# Patient Record
Sex: Female | Born: 1965 | Race: White | Hispanic: No | Marital: Married | Smoking: Never smoker
Health system: Southern US, Community
[De-identification: ages and names within clinical notes are randomized; demographics above are authoritative.]

## PROBLEM LIST (undated history)

## (undated) DIAGNOSIS — K219 Gastro-esophageal reflux disease without esophagitis: Secondary | ICD-10-CM

## (undated) DIAGNOSIS — M810 Age-related osteoporosis without current pathological fracture: Secondary | ICD-10-CM

## (undated) DIAGNOSIS — M1990 Unspecified osteoarthritis, unspecified site: Secondary | ICD-10-CM

## (undated) DIAGNOSIS — N390 Urinary tract infection, site not specified: Secondary | ICD-10-CM

## (undated) DIAGNOSIS — Z5189 Encounter for other specified aftercare: Secondary | ICD-10-CM

## (undated) DIAGNOSIS — M719 Bursopathy, unspecified: Secondary | ICD-10-CM

## (undated) HISTORY — DX: Urinary tract infection, site not specified: N39.0

## (undated) HISTORY — DX: Gastro-esophageal reflux disease without esophagitis: K21.9

## (undated) HISTORY — DX: Age-related osteoporosis without current pathological fracture: M81.0

## (undated) HISTORY — DX: Encounter for other specified aftercare: Z51.89

## (undated) HISTORY — DX: Bursopathy, unspecified: M71.9

## (undated) HISTORY — PX: CERVICAL POLYPECTOMY: SHX88

## (undated) HISTORY — PX: WISDOM TOOTH EXTRACTION: SHX21

## (undated) HISTORY — PX: TOTAL HIP ARTHROPLASTY: SHX124

## (undated) HISTORY — DX: Unspecified osteoarthritis, unspecified site: M19.90

---

## 1998-01-18 ENCOUNTER — Other Ambulatory Visit: Admission: RE | Admit: 1998-01-18 | Discharge: 1998-01-18 | Payer: Self-pay | Admitting: Obstetrics and Gynecology

## 2002-09-01 ENCOUNTER — Other Ambulatory Visit: Admission: RE | Admit: 2002-09-01 | Discharge: 2002-09-01 | Payer: Self-pay | Admitting: Obstetrics and Gynecology

## 2003-09-05 ENCOUNTER — Other Ambulatory Visit: Admission: RE | Admit: 2003-09-05 | Discharge: 2003-09-05 | Payer: Self-pay | Admitting: Obstetrics and Gynecology

## 2004-04-14 ENCOUNTER — Emergency Department (HOSPITAL_COMMUNITY): Admission: EM | Admit: 2004-04-14 | Discharge: 2004-04-14 | Payer: Self-pay | Admitting: Emergency Medicine

## 2005-01-14 ENCOUNTER — Other Ambulatory Visit: Admission: RE | Admit: 2005-01-14 | Discharge: 2005-01-14 | Payer: Self-pay | Admitting: Obstetrics and Gynecology

## 2005-07-22 ENCOUNTER — Encounter: Payer: Self-pay | Admitting: Family Medicine

## 2005-12-11 ENCOUNTER — Ambulatory Visit: Payer: Self-pay | Admitting: Gastroenterology

## 2006-01-03 ENCOUNTER — Ambulatory Visit: Payer: Self-pay | Admitting: Gastroenterology

## 2008-06-30 ENCOUNTER — Ambulatory Visit: Payer: Self-pay | Admitting: Family Medicine

## 2008-06-30 DIAGNOSIS — M1610 Unilateral primary osteoarthritis, unspecified hip: Secondary | ICD-10-CM | POA: Insufficient documentation

## 2008-06-30 DIAGNOSIS — J45909 Unspecified asthma, uncomplicated: Secondary | ICD-10-CM | POA: Insufficient documentation

## 2008-06-30 DIAGNOSIS — M169 Osteoarthritis of hip, unspecified: Secondary | ICD-10-CM

## 2008-10-28 ENCOUNTER — Ambulatory Visit: Payer: Self-pay | Admitting: Family Medicine

## 2008-11-14 ENCOUNTER — Inpatient Hospital Stay (HOSPITAL_COMMUNITY): Admission: RE | Admit: 2008-11-14 | Discharge: 2008-11-17 | Payer: Self-pay | Admitting: Orthopedic Surgery

## 2008-11-21 ENCOUNTER — Telehealth: Payer: Self-pay | Admitting: Family Medicine

## 2009-03-15 ENCOUNTER — Telehealth: Payer: Self-pay | Admitting: Family Medicine

## 2009-06-05 ENCOUNTER — Encounter: Payer: Self-pay | Admitting: Internal Medicine

## 2009-10-25 ENCOUNTER — Ambulatory Visit: Payer: Self-pay | Admitting: Family Medicine

## 2009-10-25 DIAGNOSIS — R7309 Other abnormal glucose: Secondary | ICD-10-CM

## 2009-10-31 ENCOUNTER — Ambulatory Visit: Payer: Self-pay | Admitting: Family Medicine

## 2009-11-01 ENCOUNTER — Telehealth (INDEPENDENT_AMBULATORY_CARE_PROVIDER_SITE_OTHER): Payer: Self-pay | Admitting: *Deleted

## 2009-11-01 LAB — CONVERTED CEMR LAB: Hgb A1c MFr Bld: 5.8 % (ref 4.6–6.5)

## 2010-04-17 ENCOUNTER — Encounter: Payer: Self-pay | Admitting: Family Medicine

## 2010-05-18 ENCOUNTER — Encounter: Payer: Self-pay | Admitting: Family Medicine

## 2010-05-18 ENCOUNTER — Ambulatory Visit
Admission: RE | Admit: 2010-05-18 | Discharge: 2010-05-18 | Payer: Self-pay | Source: Home / Self Care | Attending: Family Medicine | Admitting: Family Medicine

## 2010-05-18 DIAGNOSIS — R799 Abnormal finding of blood chemistry, unspecified: Secondary | ICD-10-CM | POA: Insufficient documentation

## 2010-05-21 ENCOUNTER — Encounter: Payer: Self-pay | Admitting: Family Medicine

## 2010-05-31 NOTE — Assessment & Plan Note (Signed)
Summary: pre-op visit/med clearance/pt coming fasting req A1C/per nanc...   Vital Signs:  Patient profile:   45 year old female Weight:      266 pounds Temp:     98.8 degrees F oral BP sitting:   138 / 88  (left arm) Cuff size:   large  Vitals Entered By: Sid Falcon LPN (May 18, 2010 9:10 AM)  History of Present Illness: Patient here to discuss several items. Upcoming left total hip replacement. Previous history right total hip replacement over one year ago. History of hip dysplasia.  She has history of mild intermittent asthma which is rarely a problem. No regular medications for that. Reported mild hyperglycemia though recent A1c 5.8% off medications altogether.  Recent labs per gynecologist significant for normal thyroid functions, normal DHEA level, elevated testosterone level. She has had substantial weight gain in the past year. Poor dietary compliance. Plan is to start weight loss program after her surgery.  No history of cardiac problems. No recent dyspnea, dizziness, or chest pain.  Allergies (verified): No Known Drug Allergies  Past History:  Past Surgical History: Last updated: 10/25/2009 Polyp removal 2009 two IVF procedures 2009 R THR 7/10  Family History: Last updated: 05/18/2010 Family History of Arthritis Family History Breast cancer 1st degree relative <50  grandparent Family History of Colon CA 1st degree parents relative <60  grandparent Family History Depression parent Family History Diabetes 1st degree relative parent Mother Family History High cholesterol parent Family History Lung cancer grandparent Family History of Prostate CA 1st degree relative <50 father Family History of Stroke F 1st degree relative <60  grandparent Family History of Cardiovascular disorder  grandparent Family History Hypertension parent Mother and Father  Social History: Last updated: 06/30/2008 Occupation:  self employed, Primary school teacher, Statistician Married Current Smoker Alcohol use-no Drug use-no Regular exercise-no  Risk Factors: Exercise: no (06/30/2008)  Risk Factors: Smoking Status: current (06/30/2008)  Past Medical History: Arthritis bil hips, hip dysplasia Asthma, mild intermittent PMH-FH-SH reviewed-no changes except otherwise noted, PMH-FH-SH reviewed for relevance  Family History: Family History of Arthritis Family History Breast cancer 1st degree relative <50  grandparent Family History of Colon CA 1st degree parents relative <60  grandparent Family History Depression parent Family History Diabetes 1st degree relative parent Mother Family History High cholesterol parent Family History Lung cancer grandparent Family History of Prostate CA 1st degree relative <50 father Family History of Stroke F 1st degree relative <60  grandparent Family History of Cardiovascular disorder  grandparent Family History Hypertension parent Mother and Father  Review of Systems       The patient complains of weight gain.  The patient denies anorexia, fever, weight loss, vision loss, decreased hearing, hoarseness, chest pain, syncope, dyspnea on exertion, peripheral edema, prolonged cough, headaches, hemoptysis, abdominal pain, melena, hematochezia, severe indigestion/heartburn, hematuria, incontinence, genital sores, muscle weakness, suspicious skin lesions, transient blindness, difficulty walking, depression, unusual weight change, abnormal bleeding, enlarged lymph nodes, and breast masses.    Physical Exam  General:  Well-developed,well-nourished,in no acute distress; alert,appropriate and cooperative throughout examination Head:  Normocephalic and atraumatic without obvious abnormalities. No apparent alopecia or balding. Eyes:  No corneal or conjunctival inflammation noted. EOMI. Perrla. Funduscopic exam benign, without hemorrhages, exudates or papilledema. Vision grossly normal. Ears:  External ear exam shows no  significant lesions or deformities.  Otoscopic examination reveals clear canals, tympanic membranes are intact bilaterally without bulging, retraction, inflammation or discharge. Hearing is grossly normal bilaterally. Mouth:  Oral mucosa and oropharynx  without lesions or exudates.  Teeth in good repair. Neck:  No deformities, masses, or tenderness noted. Lungs:  Normal respiratory effort, chest expands symmetrically. Lungs are clear to auscultation, no crackles or wheezes. Heart:  Normal rate and regular rhythm. S1 and S2 normal without gallop, murmur, click, rub or other extra sounds. Abdomen:  Bowel sounds positive,abdomen soft and non-tender without masses, organomegaly or hernias noted. Extremities:  No clubbing, cyanosis, edema, or deformity noted with normal full range of motion of all joints.   Neurologic:  alert & oriented X3, cranial nerves II-XII intact, and strength normal in all extremities.   Skin:  no rashes and no suspicious lesions.   Cervical Nodes:  No lymphadenopathy noted Psych:  Cognition and judgment appear intact. Alert and cooperative with normal attention span and concentration. No apparent delusions, illusions, hallucinations   Impression & Recommendations:  Problem # 1:  PREOPERATIVE EXAMINATION (ICD-V72.84) EKG unremarkable.  No contraindications for surgery. Orders: EKG w/ Interpretation (93000)  Problem # 2:  DEGENERATIVE JOINT DISEASE, HIPS (ICD-715.95)  Her updated medication list for this problem includes:    Tramadol Hcl 50 Mg Tabs (Tramadol hcl) .Marland Kitchen... 1-2 by mouth q 6 hours as needed  Problem # 3:  TESTOSTERONE, SERUM, ELEVATED (ICD-790.99) discussed fact that her weight gain has a role.  GYN apparently considering use of OCPs and  she will be starting weight loss program after surgery.  Problem # 4:  ASTHMA (ICD-493.90)  Complete Medication List: 1)  Tramadol Hcl 50 Mg Tabs (Tramadol hcl) .Marland Kitchen.. 1-2 by mouth q 6 hours as needed  Patient  Instructions: 1)  You need to lose weight. Consider a lower calorie diet and regular exercise. Consider repeat testosteone levels in 6 months after weight loss efforts.   Orders Added: 1)  EKG w/ Interpretation [93000] 2)  Est. Patient Level IV [04540]

## 2010-05-31 NOTE — Discharge Summary (Signed)
  Hospital Discharge  Date of admission:06/01/2009  Date of discharge:06/05/2009  Brief reason for admission/active problems: Cellulits, treated with vanc for 5 days, much improved since, d/c on DS bactrim for 10days .  Followup needed: check for remaining cellulits, check CBC for WBC.  Prescriptions: ZOFRAN 8 MG TABS (ONDANSETRON HCL) take one tablet by mouth every 6 hours as needed for nausea  #20 x 0   Entered and Authorized by:   Darnelle Maffucci MD   Signed by:   Darnelle Maffucci MD on 06/05/2009   Method used:   Print then Give to Patient   RxID:   [id number] BACTRIM DS 800-160 MG TABS (SULFAMETHOXAZOLE-TRIMETHOPRIM) Take 1 tablet by mouth twice a day  #20 x 0   Entered and Authorized by:   Darnelle Maffucci MD   Signed by:   Darnelle Maffucci MD on 06/05/2009   Method used:   Print then Give to Patient   RxID:   [id number]   The medication and problem lists have been updated.  Please see the dictated discharge summary for details.   Patient Instructions: 1)  Please come for a followup at the outpatient clinic at [facility], we will contact you with an appointment time and date 2)  Please take your medication as prescribed below. 3)  If you have any problem, Please call the clinic or if it's an emergency go to the emergency department or dial 911.

## 2010-05-31 NOTE — Letter (Signed)
Summary: Pre-Operative Clearance  Pre-Operative Clearance   Imported By: Georgian Co 05/21/2010 08:28:04  _____________________________________________________________________  External Attachment:    Type:   Image     Comment:   External Document

## 2010-05-31 NOTE — Assessment & Plan Note (Signed)
Summary: NECK& EAR PAIN/RCD   Vital Signs:  Patient profile:   45 year old female Weight:      261 pounds Temp:     98.8 degrees F oral BP sitting:   122 / 90  (left arm) Cuff size:   large  Vitals Entered By: Sid Falcon LPN (October 25, 2009 11:09 AM) CC: ear, neck pain, jaw popps when eating   History of Present Illness: Monday onset of R ear pain.  Pain is actually below ear. Radiates to clavicle region.  No nodes.  No sore throat. Some pain with swallowing but not with chewing. Popping sensation jaw when eats.  She points to area of pain mostly R sternocleidomastoid region.  No injury.  No alleviating factors.  Mild aggravation with movement of neck.  No radiculopathy symptoms.  Pt reports that she was started within the past year on metformin for "weight loss" per her gyn.  She thinks her glucose might have been up but she does not know how high.  She does not recall ever having A1C. NO recent symptoms of thirst or urine frequency.  steady weight gain this past year.  R THR 7/10 and that went uneventfully.  Ambulating better.  Anticipates L replacement in future.  Allergies (verified): No Known Drug Allergies  Past History:  Past Medical History: Last updated: 06/30/2008 Arthritis bil hips Astihma child  Family History: Last updated: 06/30/2008 Family History of Arthritis Family History Breast cancer 1st degree relative <50  grandparent Family History of Colon CA 1st degree parents relative <60  grandparent Family History Depression parent Family History Diabetes 1st degree relative parent Family History High cholesterol parent Family History Lung cancer grandparent Family History of Prostate CA 1st degree relative <50 father Family History of Stroke F 1st degree relative <60  grandparent Family History of Cardiovascular disorder  grandparent Family History Hypertension parent  Social History: Last updated: 06/30/2008 Occupation:  self employed, Materials engineer, Engineer, maintenance (IT) Married Current Smoker Alcohol use-no Drug use-no Regular exercise-no  Past Surgical History: Polyp removal 2009 two IVF procedures 2009 R THR 7/10 PMH-FH-SH reviewed for relevance  Review of Systems       The patient complains of weight gain.  The patient denies fever, weight loss, decreased hearing, hoarseness, chest pain, syncope, dyspnea on exertion, and headaches.    Physical Exam  General:  Well-developed,well-nourished,in no acute distress; alert,appropriate and cooperative throughout examination Head:  signif click or popping sensation over TMJ joints bil with motion of mandible but nontender. Ears:  External ear exam shows no significant lesions or deformities.  Otoscopic examination reveals clear canals, tympanic membranes are intact bilaterally without bulging, retraction, inflammation or discharge. Hearing is grossly normal bilaterally. Nose:  External nasal examination shows no deformity or inflammation. Nasal mucosa are pink and moist without lesions or exudates. Mouth:  Oral mucosa and oropharynx without lesions or exudates.  Teeth in good repair. Neck:  Full ROM.  Mild tender of mid portion of R sternocleidomastoid.   No ecchymosis. No adenopathy and no mass.  No carotid bruits. Lungs:  Normal respiratory effort, chest expands symmetrically. Lungs are clear to auscultation, no crackles or wheezes. Heart:  normal rate, regular rhythm, and no murmur.   Skin:  no rashes. Cervical Nodes:  No lymphadenopathy noted   Impression & Recommendations:  Problem # 1:  NECK PAIN (ICD-723.1) Assessment New suspect muscular.  No adenopathy or mass.  Try NSAID. Her updated medication list for this problem includes:  Tramadol Hcl 50 Mg Tabs (Tramadol hcl) .Marland Kitchen... 1-2 by mouth q 6 hours as needed  Problem # 2:  HYPERGLYCEMIA (ICD-790.29) Assessment: New schedule fasting CBG and A1C.  Discussed diet and exercise.  Needs to work on weight loss. Her  updated medication list for this problem includes:    Metformin Hcl 500 Mg Tabs (Metformin hcl) ..... One tab three times a day  Problem # 3:  DEGENERATIVE JOINT DISEASE, HIPS (ICD-715.95) refilled tramadol.  Discussed appropriate exercises and need for weight loss. Her updated medication list for this problem includes:    Tramadol Hcl 50 Mg Tabs (Tramadol hcl) .Marland Kitchen... 1-2 by mouth q 6 hours as needed  Complete Medication List: 1)  Bactrim Ds 800-160 Mg Tabs (Sulfamethoxazole-trimethoprim) .... Take 1 tablet by mouth twice a day 2)  Zofran 8 Mg Tabs (Ondansetron hcl) .... Take one tablet by mouth every 6 hours as needed for nausea 3)  Metformin Hcl 500 Mg Tabs (Metformin hcl) .... One tab three times a day 4)  Tramadol Hcl 50 Mg Tabs (Tramadol hcl) .Marland Kitchen.. 1-2 by mouth q 6 hours as needed  Patient Instructions: 1)  Schedule fasting glucose and Hgb A1C in the next couple of weeks:  250.00 2)  It is important that you exercise reguarly at least 20 minutes 5 times a week. If you develop chest pain, have severe difficulty breathing, or feel very tired, stop exercising immediately and seek medical attention.  3)  You need to lose weight. Consider a lower calorie diet and regular exercise.  4)  Try Alleve or advil for the next few days for neck pain. Prescriptions: TRAMADOL HCL 50 MG TABS (TRAMADOL HCL) 1-2 by mouth q 6 hours as needed  #120 x 5   Entered and Authorized by:   Evelena Peat MD   Signed by:   Evelena Peat MD on 10/25/2009   Method used:   Electronically to        [facility]       [address]       [place], [place]  [address]       Ph: [phone]       Fax: [phone]   RxID:   [id number]

## 2010-05-31 NOTE — Progress Notes (Signed)
  Phone Note Call from Patient   Summary of Call: Pt wanders if she should quit taking Metformin and repeat A1C?  Initial call taken by: Josph Macho RMA,  November 01, 2009 10:09 AM  Follow-up for Phone Call        I think this would be reasonable.  would consider recheck in 3 months.  In the meantime, work on weight loss. Follow-up by: Evelena Peat MD,  November 01, 2009 12:50 PM  Additional Follow-up for Phone Call Additional follow up Details #1::        Informed pt Additional Follow-up by: Josph Macho RMA,  November 01, 2009 1:15 PM

## 2010-06-06 NOTE — Letter (Signed)
Summary: Request for Surgical Clearance/[facility]  Request for Surgical Clearance/[facility]   Imported By: Maryln Gottron 05/29/2010 11:19:20  _____________________________________________________________________  External Attachment:    Type:   Image     Comment:   External Document

## 2010-06-23 ENCOUNTER — Emergency Department (HOSPITAL_BASED_OUTPATIENT_CLINIC_OR_DEPARTMENT_OTHER)
Admission: EM | Admit: 2010-06-23 | Discharge: 2010-06-23 | Disposition: A | Discharge status: Home / Self Care | Payer: 59 | Attending: Emergency Medicine | Admitting: Emergency Medicine

## 2010-06-23 DIAGNOSIS — W260XXA Contact with knife, initial encounter: Secondary | ICD-10-CM | POA: Insufficient documentation

## 2010-06-23 DIAGNOSIS — S61209A Unspecified open wound of unspecified finger without damage to nail, initial encounter: Secondary | ICD-10-CM | POA: Insufficient documentation

## 2010-06-23 DIAGNOSIS — Y92009 Unspecified place in unspecified non-institutional (private) residence as the place of occurrence of the external cause: Secondary | ICD-10-CM | POA: Insufficient documentation

## 2010-06-23 DIAGNOSIS — W261XXA Contact with sword or dagger, initial encounter: Secondary | ICD-10-CM | POA: Insufficient documentation

## 2010-06-27 ENCOUNTER — Encounter (HOSPITAL_COMMUNITY): Payer: 59

## 2010-06-27 ENCOUNTER — Other Ambulatory Visit: Payer: Self-pay | Admitting: Orthopedic Surgery

## 2010-06-27 ENCOUNTER — Other Ambulatory Visit (HOSPITAL_COMMUNITY): Payer: Self-pay | Admitting: Orthopedic Surgery

## 2010-06-27 ENCOUNTER — Ambulatory Visit (HOSPITAL_COMMUNITY)
Admission: RE | Admit: 2010-06-27 | Discharge: 2010-06-27 | Disposition: A | Discharge status: Home / Self Care | Payer: 59 | Source: Ambulatory Visit | Attending: Orthopedic Surgery | Admitting: Orthopedic Surgery

## 2010-06-27 DIAGNOSIS — Z01811 Encounter for preprocedural respiratory examination: Secondary | ICD-10-CM | POA: Insufficient documentation

## 2010-06-27 DIAGNOSIS — M169 Osteoarthritis of hip, unspecified: Secondary | ICD-10-CM | POA: Insufficient documentation

## 2010-06-27 DIAGNOSIS — M1610 Unilateral primary osteoarthritis, unspecified hip: Secondary | ICD-10-CM | POA: Insufficient documentation

## 2010-06-27 DIAGNOSIS — Z01818 Encounter for other preprocedural examination: Secondary | ICD-10-CM | POA: Insufficient documentation

## 2010-06-27 DIAGNOSIS — Z01812 Encounter for preprocedural laboratory examination: Secondary | ICD-10-CM | POA: Insufficient documentation

## 2010-06-27 LAB — COMPREHENSIVE METABOLIC PANEL
ALT: 21 U/L (ref 0–35)
Alkaline Phosphatase: 57 U/L (ref 39–117)
BUN: 21 mg/dL (ref 6–23)
CO2: 23 mEq/L (ref 19–32)
Chloride: 108 mEq/L (ref 96–112)
Glucose, Bld: 103 mg/dL — ABNORMAL HIGH (ref 70–99)
Potassium: 4.1 mEq/L (ref 3.5–5.1)
Sodium: 139 mEq/L (ref 135–145)
Total Bilirubin: 0.6 mg/dL (ref 0.3–1.2)

## 2010-06-27 LAB — URINALYSIS, ROUTINE W REFLEX MICROSCOPIC
Leukocytes, UA: NEGATIVE
Protein, ur: NEGATIVE mg/dL
Urine Glucose, Fasting: NEGATIVE mg/dL
pH: 7 (ref 5.0–8.0)

## 2010-06-27 LAB — SURGICAL PCR SCREEN: Staphylococcus aureus: NEGATIVE

## 2010-06-27 LAB — URINE MICROSCOPIC-ADD ON

## 2010-06-27 LAB — CBC
HCT: 42.3 % (ref 36.0–46.0)
Hemoglobin: 13.7 g/dL (ref 12.0–15.0)
MCV: 91 fL (ref 78.0–100.0)
RBC: 4.65 MIL/uL (ref 3.87–5.11)
RDW: 13.7 % (ref 11.5–15.5)
WBC: 7.2 10*3/uL (ref 4.0–10.5)

## 2010-06-27 LAB — HCG, SERUM, QUALITATIVE: Preg, Serum: NEGATIVE

## 2010-06-27 LAB — PROTIME-INR: Prothrombin Time: 13.2 seconds (ref 11.6–15.2)

## 2010-07-04 ENCOUNTER — Inpatient Hospital Stay (HOSPITAL_COMMUNITY)
Admission: RE | Admit: 2010-07-04 | Discharge: 2010-07-07 | DRG: 470 | Disposition: A | Discharge status: Home Health | Payer: 59 | Source: Ambulatory Visit | Attending: Orthopedic Surgery | Admitting: Orthopedic Surgery

## 2010-07-04 ENCOUNTER — Inpatient Hospital Stay (HOSPITAL_COMMUNITY): Payer: 59

## 2010-07-04 DIAGNOSIS — M1610 Unilateral primary osteoarthritis, unspecified hip: Principal | ICD-10-CM | POA: Diagnosis present

## 2010-07-04 DIAGNOSIS — I959 Hypotension, unspecified: Secondary | ICD-10-CM | POA: Diagnosis present

## 2010-07-04 DIAGNOSIS — E669 Obesity, unspecified: Secondary | ICD-10-CM | POA: Diagnosis present

## 2010-07-04 DIAGNOSIS — H547 Unspecified visual loss: Secondary | ICD-10-CM | POA: Diagnosis present

## 2010-07-04 DIAGNOSIS — Z87440 Personal history of urinary (tract) infections: Secondary | ICD-10-CM

## 2010-07-04 DIAGNOSIS — Z96649 Presence of unspecified artificial hip joint: Secondary | ICD-10-CM

## 2010-07-04 DIAGNOSIS — M169 Osteoarthritis of hip, unspecified: Principal | ICD-10-CM | POA: Diagnosis present

## 2010-07-04 DIAGNOSIS — Z791 Long term (current) use of non-steroidal anti-inflammatories (NSAID): Secondary | ICD-10-CM

## 2010-07-04 DIAGNOSIS — R110 Nausea: Secondary | ICD-10-CM | POA: Diagnosis not present

## 2010-07-04 LAB — CBC
MCHC: 32.2 g/dL (ref 30.0–36.0)
RDW: 13.3 % (ref 11.5–15.5)
WBC: 13.3 10*3/uL — ABNORMAL HIGH (ref 4.0–10.5)

## 2010-07-04 LAB — GLUCOSE, CAPILLARY: Glucose-Capillary: 140 mg/dL — ABNORMAL HIGH (ref 70–99)

## 2010-07-04 LAB — TYPE AND SCREEN
ABO/RH(D): O POS
Antibody Screen: NEGATIVE

## 2010-07-05 LAB — BASIC METABOLIC PANEL
Calcium: 8.1 mg/dL — ABNORMAL LOW (ref 8.4–10.5)
GFR calc Af Amer: >60 mL/min (ref >60)
GFR calc non Af Amer: >60 mL/min (ref >60)
Potassium: 3.6 mEq/L (ref 3.5–5.1)
Sodium: 136 mEq/L (ref 135–145)

## 2010-07-05 LAB — GLUCOSE, CAPILLARY
Glucose-Capillary: 103 mg/dL — ABNORMAL HIGH (ref 70–99)
Glucose-Capillary: 114 mg/dL — ABNORMAL HIGH (ref 70–99)
Glucose-Capillary: 118 mg/dL — ABNORMAL HIGH (ref 70–99)
Glucose-Capillary: 125 mg/dL — ABNORMAL HIGH (ref 70–99)
Glucose-Capillary: 136 mg/dL — ABNORMAL HIGH (ref 70–99)

## 2010-07-05 LAB — CBC
MCH: 28.2 pg (ref 26.0–34.0)
MCHC: 31.6 g/dL (ref 30.0–36.0)
MCHC: 31.3 g/dL (ref 30.0–36.0)
Platelets: 265 10*3/uL (ref 150–400)
RDW: 13.3 % (ref 11.5–15.5)
RDW: 13.4 % (ref 11.5–15.5)
WBC: 12.6 10*3/uL — ABNORMAL HIGH (ref 4.0–10.5)

## 2010-07-05 NOTE — Op Note (Signed)
[name], Abigail [name] NO.:  [id number]  MEDICAL RECORD NO.:  [id number]           PATIENT TYPE:  I  LOCATION:  0007                         FACILITY:  [facility]  PHYSICIAN:  Ollen Gross, M.D.    DATE OF BIRTH:  02-23-1966  DATE OF PROCEDURE:  07/04/2010 DATE OF DISCHARGE:                              OPERATIVE REPORT   PREOPERATIVE DIAGNOSIS:  Osteoarthritis left hip.  POSTOPERATIVE DIAGNOSIS:  Osteoarthritis left hip.  PROCEDURE:  Left total hip arthroplasty.  SURGEON:  Ollen Gross, M.D.  ASSISTANT:  Alexzandrew L. Perkins, P.A.C.  ANESTHESIA:  General.  ESTIMATED BLOOD LOSS:  600.  DRAINS:  Hemovac x1.  COMPLICATIONS:  None.  CONDITION:  Stable to recovery.  CLINICAL NOTE:  Abigail [name] is a 45 year old female with dysplasia and advanced end-stage arthritis of the left hip with progressively worsening pain and dysfunction.  She had a previous successful right total hip arthroplasty and presents now for left total hip arthroplasty.  PROCEDURE IN DETAIL:  After successful initiation of general anesthetic, the patient is placed a right lateral decubitus position with left side up and held with the hip positioner.  Left lower extremity is isolated from perineum with plastic drapes and prepped and draped in the usual sterile fashion.  Posterolateral incision was made with a 10 blade through subcutaneous tissue to the fascia lata which was incised in line with the skin incision.  Sciatic nerve was palpated and protected and short rotators and capsule isolated off the femur.  The hip is then dislocated and the center of the femoral head is marked.  Trial prosthesis is placed such that the center of the trial head corresponds to the level of the center of native femoral head.  Osteotomy line is marked on the femoral neck and osteotomy made with an oscillating saw. Femoral head is removed, and then retractor is placed around the proximal femur for access to  the canal.  Starter reamer was placed in the femoral canal, and then the canal was thoroughly irrigated with saline.  Axial reaming is performed up to 13.5 mm proximal reaming to 18 F and a sleeve machine to a large.  Trial 18 F large sleeve is placed.  Femur is retracted anteriorly to gain acetabular exposure.  Acetabular retractors were placed and labrum and osteophytes removed.  Reaming starts at 45.  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  I reamed it to medial wall.  Reamed up to 51 for placement of 52 mm Pinnacle acetabular shell.  There was excellent purchase with the cup. I also placed 2 dome screws.  Apex hole eliminator is placed and then the 32 mm neutral +4 Marathon liner was placed.  The trial femur which was 18 x 13 with 36+ 8 neck matching native anteversion is placed.  The 32+ 0 head is placed, it reduces too easily, so entered 32+ 6 which had appropriate soft-tissue tension.  Stability was fantastic with full extension, full external rotation, 70 degrees flexion, 40 degrees adduction, 90 degrees internal rotation, and 90 degrees of flexion, and 70 degrees of internal rotation.  By placing the  left leg on top of the right leg, it was felt the leg lengths were equal.  Hip is then dislocated and trials removed.  Permanent 18 F large sleeve is placed with the 18 x 13 stem and the 36+ 8 neck matching native anteversion.  The 32+ 6 ceramic head is placed.  The hip is reduced with the same stability parameters.  The wound was copiously irrigated with saline solution and then the short rotators and capsule, reattached the femur through drill holes with Ethibond suture.  The fascia lata is closed over Hemovac drain with interrupted #1 Vicryl, subcutaneous closed in multiple layers with #1 and 2-0 Vicryl and subcuticular running 4-0 Monocryl.  Incisions cleaned and dried and Steri-Strips applied.  The catheter for the Marcaine pain pump is placed and pump is  initiated.  A bulky sterile dressings is then applied, and she is placed into a knee immobilizer, awakened and transported to recovery in stable condition.     Ollen Gross, M.D.     FA/MEDQ  D:  07/04/2010  T:  07/04/2010  Job:  [id number]  Electronically Signed by Ollen Gross M.D. on 07/04/2010 03:51:31 PM

## 2010-07-05 NOTE — H&P (Addendum)
NAMEMARYPAT, KIMMET NO.:  [id number]  MEDICAL RECORD NO.:  [id number]           PATIENT TYPE:  I  LOCATION:  0007                         [facility]ACILITY[facility]  PHYSICIAN:  Ollen Gross, M.D.    DATE OF BIRTH:  1966-01-20  DATE OF ADMISSION:  07/04/2010 DATE OF DISCHARGE:                             HISTORY & PHYSICAL   CHIEF COMPLAINT:  Left hip pain.  BRIEF HISTORY:  Abigail [name] has been followed by Dr. Lequita Halt for worsening pain in the left hip.  She is about a year and half out from right total hip arthroplasty and she has done well with that.  She feels that this time the right hip has healed up enough and the left hip is becoming more and more painful and dysfunctional.  She now presents for left total hip arthroplasty.  PRIMARY CARE PHYSICIAN:  Abigail [name], M.D. and she has been cleared for surgery by Dr. Caryl Never and found to be very low risk.  MEDICATION ALLERGIES:  SHE WOULD PREFER THE USE OF PAPER TAPE.  CURRENT MEDICATIONS:  Tramadol 50 mg 1-2 tablets p.o. q. 4-6 hours p.r.n. pain.  PAST MEDICAL HISTORY: 1. End-stage arthritis of the left hip. 2. Impaired vision. 3. Tinnitus. 4. History of asthma as a child. 5. History of bronchitis. 6. Hemorrhoids. 7. History of urinary tract infection. 8. Arthritis. 9. Bursitis.  PAST SURGICAL HISTORY:  Right total hip arthroplasty in July 2010.  FAMILY MEDICAL HISTORY:  Father is 24.  He has history of ulcers and had a cholecystectomy.  Mother is deceased, she passed at the age of 58, she had diabetes.  SOCIAL HISTORY:  The patient is married.  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  She denies past or present use of tobacco products.  States that she drinks alcohol rarely.  She lives at home with her husband.  She does plan to go home following her hospital stay.  Her husband, also her father and mother-in-law will be around to take care of her.  Her home is one  level, so stairs will not be in issue.  We will send therapy at her house.  REVIEW OF SYSTEMS:  GENERAL:  Negative for fevers, chills or weight change.  HEENT: NEURO:  Minimal tinnitus.  Negative for blurred vision or balance problems.  DERMATOLOGIC:  She has sensitive skin. RESPIRATORY:  Negative for shortness of breath at rest or with exertion. CARDIOVASCULAR:  Negative for chest pain.  Last EKG was May 18, 2010.  GI:  Negative for nausea, vomiting or diarrhea.  GU:  Negative hematuria, dysuria.  MUSCULOSKELETAL:  Positive for joint pain, back pain and plantar fasciitis of the right foot.  PHYSICAL EXAMINATION:  VITAL SIGNS:  Pulse 80, respirations 18, blood pressure 116/78 in the left arm. GENERAL:  Abigail [name] is alert and oriented x3.  She is well developed and well nourished, in no apparent distress.  She is a pleasant 45 year old female.  She is a stated height of 5 feet 6 inches and a stated weight of 253 pounds. HEENT:  Normocephalic, atraumatic.  Extraocular  movements intact.  The patient wears glasses. NECK:  Supple.  Full range of motion without lymphadenopathy. CHEST:  Lungs are clear to auscultation bilaterally without wheezes. HEART:  Regular rate and rhythm without murmur, S1 and S2 sounds are appreciated. ABDOMEN:  Bowel sounds present in all 4 quadrants. EXTREMITIES:  Left hip, able to flex to 90 degrees.  No internal rotation, only about 10 degrees of external rotation and 10-20 degrees of abduction. SKIN:  Unremarkable. NEUROLOGIC:  Intact. PERIPHERAL VASCULAR:  Carotid pulses 2+ bilaterally without bruit.  RADIOGRAPH:  AP and lateral views of the patient's left hip revealed advanced end-stage arthritis with changes secondary to dysplasia.  IMPRESSION:  End-stage arthritis of the left hip.  PLAN:  Left total hip arthroplasty to be performed by Dr. Lequita Halt.     Rozell Searing, PAC   ______________________________ Ollen Gross, M.D.    LD/MEDQ   D:  07/04/2010  T:  07/04/2010  Job:  [id number]  Electronically Signed by Ollen Gross M.D. on 07/04/2010 03:51:28 PM Electronically Signed by Rozell Searing  on 07/05/2010 07:38:47 AM

## 2010-07-06 LAB — BASIC METABOLIC PANEL
BUN: 7 mg/dL (ref 6–23)
Creatinine, Ser: 0.64 mg/dL (ref 0.4–1.2)
GFR calc non Af Amer: >60 mL/min (ref >60)
Glucose, Bld: 141 mg/dL — ABNORMAL HIGH (ref 70–99)
Potassium: 3.1 mEq/L — ABNORMAL LOW (ref 3.5–5.1)

## 2010-07-06 LAB — GLUCOSE, CAPILLARY
Glucose-Capillary: 107 mg/dL — ABNORMAL HIGH (ref 70–99)
Glucose-Capillary: 117 mg/dL — ABNORMAL HIGH (ref 70–99)

## 2010-07-06 LAB — CBC
HCT: 32.3 % — ABNORMAL LOW (ref 36.0–46.0)
MCH: 28.5 pg (ref 26.0–34.0)
MCHC: 31.3 g/dL (ref 30.0–36.0)
MCV: 91.2 fL (ref 78.0–100.0)
RDW: 13.6 % (ref 11.5–15.5)
WBC: 11.1 10*3/uL — ABNORMAL HIGH (ref 4.0–10.5)

## 2010-07-07 LAB — BASIC METABOLIC PANEL
BUN: 8 mg/dL (ref 6–23)
CO2: 29 mEq/L (ref 19–32)
Chloride: 106 mEq/L (ref 96–112)
Creatinine, Ser: 0.56 mg/dL (ref 0.4–1.2)

## 2010-07-07 LAB — CBC
Hemoglobin: 10.3 g/dL — ABNORMAL LOW (ref 12.0–15.0)
MCH: 28.6 pg (ref 26.0–34.0)
MCV: 91.4 fL (ref 78.0–100.0)
RBC: 3.6 MIL/uL — ABNORMAL LOW (ref 3.87–5.11)

## 2010-08-05 LAB — GLUCOSE, CAPILLARY
Glucose-Capillary: 100 mg/dL — ABNORMAL HIGH (ref 70–99)
Glucose-Capillary: 104 mg/dL — ABNORMAL HIGH (ref 70–99)
Glucose-Capillary: 113 mg/dL — ABNORMAL HIGH (ref 70–99)
Glucose-Capillary: 120 mg/dL — ABNORMAL HIGH (ref 70–99)
Glucose-Capillary: 124 mg/dL — ABNORMAL HIGH (ref 70–99)
Glucose-Capillary: 129 mg/dL — ABNORMAL HIGH (ref 70–99)
Glucose-Capillary: 147 mg/dL — ABNORMAL HIGH (ref 70–99)
Glucose-Capillary: 149 mg/dL — ABNORMAL HIGH (ref 70–99)
Glucose-Capillary: 82 mg/dL (ref 70–99)

## 2010-08-05 LAB — CBC
HCT: 24.4 % — ABNORMAL LOW (ref 36.0–46.0)
HCT: 29.2 % — ABNORMAL LOW (ref 36.0–46.0)
Hemoglobin: 8.5 g/dL — ABNORMAL LOW (ref 12.0–15.0)
Hemoglobin: 9.4 g/dL — ABNORMAL LOW (ref 12.0–15.0)
MCHC: 33.5 g/dL (ref 30.0–36.0)
MCHC: 34.7 g/dL (ref 30.0–36.0)
MCV: 88.2 fL (ref 78.0–100.0)
MCV: 89.1 fL (ref 78.0–100.0)
Platelets: 211 10*3/uL (ref 150–400)
Platelets: 230 10*3/uL (ref 150–400)
Platelets: 300 10*3/uL (ref 150–400)
RBC: 2.74 MIL/uL — ABNORMAL LOW (ref 3.87–5.11)
RBC: 3.15 MIL/uL — ABNORMAL LOW (ref 3.87–5.11)
RBC: 3.31 MIL/uL — ABNORMAL LOW (ref 3.87–5.11)
RBC: 4.43 MIL/uL (ref 3.87–5.11)
RDW: 13.3 % (ref 11.5–15.5)
WBC: 10.3 10*3/uL (ref 4.0–10.5)
WBC: 8 10*3/uL (ref 4.0–10.5)
WBC: 8.5 10*3/uL (ref 4.0–10.5)
WBC: 9.7 10*3/uL (ref 4.0–10.5)

## 2010-08-05 LAB — BASIC METABOLIC PANEL
BUN: 4 mg/dL — ABNORMAL LOW (ref 6–23)
CO2: 26 mEq/L (ref 19–32)
CO2: 27 mEq/L (ref 19–32)
CO2: 28 mEq/L (ref 19–32)
Calcium: 7.6 mg/dL — ABNORMAL LOW (ref 8.4–10.5)
Calcium: 7.8 mg/dL — ABNORMAL LOW (ref 8.4–10.5)
Chloride: 105 mEq/L (ref 96–112)
Creatinine, Ser: 0.49 mg/dL (ref 0.4–1.2)
Creatinine, Ser: 0.55 mg/dL (ref 0.4–1.2)
Creatinine, Ser: 0.58 mg/dL (ref 0.4–1.2)
GFR calc Af Amer: >60 mL/min (ref >60)
Glucose, Bld: 148 mg/dL — ABNORMAL HIGH (ref 70–99)
Potassium: 3.2 mEq/L — ABNORMAL LOW (ref 3.5–5.1)
Sodium: 135 mEq/L (ref 135–145)
Sodium: 140 mEq/L (ref 135–145)

## 2010-08-05 LAB — TYPE AND SCREEN
ABO/RH(D): O POS
Antibody Screen: NEGATIVE

## 2010-08-05 LAB — COMPREHENSIVE METABOLIC PANEL
ALT: 16 U/L (ref 0–35)
AST: 16 U/L (ref 0–37)
Albumin: 3.8 g/dL (ref 3.5–5.2)
CO2: 27 mEq/L (ref 19–32)
Chloride: 107 mEq/L (ref 96–112)
Creatinine, Ser: 0.62 mg/dL (ref 0.4–1.2)
GFR calc Af Amer: >60 mL/min (ref >60)
Potassium: 4.4 mEq/L (ref 3.5–5.1)
Sodium: 140 mEq/L (ref 135–145)
Total Bilirubin: 0.7 mg/dL (ref 0.3–1.2)

## 2010-08-05 LAB — URINALYSIS, ROUTINE W REFLEX MICROSCOPIC
Bilirubin Urine: NEGATIVE
Glucose, UA: NEGATIVE mg/dL
Nitrite: NEGATIVE
Specific Gravity, Urine: 1.029 (ref 1.005–1.030)
pH: 7.5 (ref 5.0–8.0)

## 2010-08-05 LAB — PROTIME-INR
INR: 1.2 (ref 0.00–1.49)
INR: 1.7 — ABNORMAL HIGH (ref 0.00–1.49)
Prothrombin Time: 15.7 seconds — ABNORMAL HIGH (ref 11.6–15.2)
Prothrombin Time: 20.5 seconds — ABNORMAL HIGH (ref 11.6–15.2)
Prothrombin Time: 21.2 seconds — ABNORMAL HIGH (ref 11.6–15.2)

## 2010-08-05 LAB — APTT: aPTT: 31 seconds (ref 24–37)

## 2010-08-10 NOTE — Discharge Summary (Signed)
**Note Abigail via Obfuscation** [name], [name]               ACCOUNT NO.:  [id number]  MEDICAL RECORD NO.:  [id number]           PATIENT TYPE:  I  LOCATION:  1538                         FACILITY:  [facility]  PHYSICIAN:  Ollen Gross, M.D.    DATE OF BIRTH:  [birth year]  DATE OF ADMISSION:  07/04/2010 DATE OF DISCHARGE:  07/07/2010                              DISCHARGE SUMMARY   ADMITTING DIAGNOSES: 1. Osteoarthritis, left hip. 2. Impaired vision. 3. Tinnitus. 4. Childhood asthma. 5. History of bronchitis. 6. Hemorrhoids. 7. Past history of urinary tract infection. 8. History of bursitis. 9. History of osteoarthritis.  DISCHARGE DIAGNOSES: 1. Osteoarthritis, left hip, status post left total hip replacement     arthroplasty. 2. Postop hypokalemia, improved. 3. Impaired vision. 4. Tinnitus. 5. Childhood asthma. 6. History of bronchitis. 7. Hemorrhoids. 8. Past history of urinary tract infection. 9. History of bursitis. 10.History of osteoarthritis.  PROCEDURE:  July 04, 2010, left total hip; surgeon, Ollen Gross, MD; assistant, Alexzandrew L. Perkins, PAC; anesthesia, general; blood loss, 600 cc.  CONSULTS:  None.  BRIEF HISTORY:  Abigail [name] is a 45 year old female with dysplasia and advanced arthritis of the left hip with progressive worsening pain and dysfunction.  She has had a successful right total hip and now presents for left total hip.  LABORATORY DATA:  I do not have the admission CBC because it was not scanned into the chart, but a postop hemoglobin was 11.6, then it dropped down to 10.5, were stabilized.  Last hemoglobin and hematocrit was 10.3 and 32.9.  Platelets postoperative 274, remained and last noted at 272.  Occult blood was negative on July 06, 2010.  The admission Chem panel again was not scanned into the chart, but BMETs followed for 3 days showed a drop in potassium down to 3.1, is back up to 4.0; glucose went up to 141, was back down to 104.  X-RAYS:  Portable hip and  pelvis film on the left showed expected appearance of left hip arthroplasty postop.  HOSPITAL COURSE:  The patient was admitted to [facility], taken to OR, underwent above-stated procedure without complication. Later transferred to recovery room in stable condition.  Right before awakening patient, the CRNA did state that her NG tube aspirate was blood tinged and this was noted in her chart.  I discussed this with the patient on postop day #1.  She had a little bit of hypotension following surgery, but bolus with fluids and pressure was essentially stable after that.  We did discuss with her on day #1 about the bloody-tinged aspirate and she had been on Advil preop for quite some time now and felt that she may have a little bit of irritation, gastric irritation, or early gastritis, but had not had any obvious blood in her stools.  We prophylactically placed her on GI medication.  She did have previous notes in the computer from her GI office stating she had some hematochezia back in August 2007 and was noted to have some internal hemorrhoids at that point, but did not give any prior history of known gastritis or ulcers.  Due to her history of  NSAID use and the dark likely bloody aspirate from the NG tube aspirate, we put her on Prilosec 40 twice a day.  We are going to check her stools.  She was on the blood thinner Xarelto, so we monitored that quite closely.  Hemoglobin was stable at 10.5 on postop day #1.  By postop day #2, hemoglobin was stable at 10.  The incision looked good and once we changed the dressing she had no complaints.  Her potassium was down a little bit down to 3.1, felt to be a dilutional component, so we did put her on potassium supplementation.  She was progressing well with her therapy being partial weightbearing.  We did check her stool and was heme negative. She did not show any obvious signs.  She remained in the hospital 1 more day and on postop day  #3.  She was doing well, meeting her goals, and was discharged home at that time.  DISCHARGE PLAN: 1. The patient was discharged home on July 07, 2010. 2. Discharge diagnoses, please see above. 3. Discharge medications, Percocet, Xarelto for 2 weeks, omeprazole 40     mg twice a day, and Robaxin 500 mg.  Hold tramadol, hold Advil,     hold Advil PM.  FOLLOWUP:  She needs to follow up with Dr. Lequita Halt in the office 2 weeks from surgery.  She is also recommended to follow up with Dr. Evelena Peat within the next month just for further workup and care and also to determine if she needs any length or duration for the Prilosec. The Prilosec was started as a new medication postoperatively just as a precaution and we will defer to Dr. Caryl Never to see if the patient needs to have this continued any further at that point.  I did recommend that she stay off her Advil from now on.  DISPOSITION:  Home.  CONDITION ON DISCHARGE:  Improving.    Alexzandrew L. Julien Girt, P.A.C.   ______________________________ Ollen Gross, M.D.   ALP/MEDQ  D:  08/02/2010  T:  08/02/2010  Job:  [id number]  cc:   Evelena Peat, M.D.  Electronically Signed by Patrica Duel P.A.C. on 08/06/2010 07:50:08 AM Electronically Signed by Ollen Gross M.D. on 08/10/2010 09:01:18 AM

## 2010-09-11 NOTE — Op Note (Signed)
NAMEADISEN, BENNION NO.:  [id number]   MEDICAL RECORD NO.:  [id number]          PATIENT TYPE:  INP   LOCATION:  0011                         FACILITY:  [facility]   PHYSICIAN:  Ollen Gross, M.D.    DATE OF BIRTH:  [birth year]   DATE OF PROCEDURE:  11/14/2008  DATE OF DISCHARGE:                               OPERATIVE REPORT   PREOPERATIVE DIAGNOSIS:  Osteoarthritis and dysplasia, right hip.   POSTOPERATIVE DIAGNOSIS:  Osteoarthritis and dysplasia, right hip.   PROCEDURE:  Right total hip arthroplasty.   SURGEON:  Ollen Gross, M.D.   ASSISTANT:  Avel Peace PA-C   ANESTHESIA:  General.   ESTIMATED BLOOD LOSS:  900 mL.   DRAIN:  Hemovac times one.   COMPLICATIONS:  None.   CONDITION:  Stable to recovery.   CLINICAL NOTE:  Abigail [name] is a 45 year old female who has severe dysplasia  of both hips with secondary degenerative changes in both, right more  symptomatic than the left.  She has had intractable pain and dysfunction  and presents now for total hip arthroplasty.   PROCEDURE IN DETAIL:  After successful administration of general  anesthetic, the patient is placed in left lateral decubitus position  with the right side up and held with the hip positioner.  Right lower  extremity isolated from her perineum with plastic drapes and prepped and  draped in the usual sterile fashion.  Standard posterolateral incision  was made with a 10 blade through the skin and then through a very deep  layer of subcutaneous tissue down to the level of fascia lata.  Fascia  lata was then incised in line with the skin incision.  The sciatic nerve  was palpated and protected and short rotators and capsule isolated off  the femur.  The hip was dislocated.  The femoral head is grossly  misshapen consistent with her dysplasia.  I did find the center of the  head and placed a trial prosthesis such that the center of the trial  head corresponds to the center of native femoral  head.  The osteotomy  line is then marked on the femoral neck and osteotomy made with an  oscillating saw.  Femoral head is removed and the femur retracted  anteriorly to gain acetabular exposure.  The acetabulum was also  severely dysplastic.  Acetabular retractors were placed and large  hypertrophic labrum is removed.  We did not encounter any significant  osteophytes.  After the acetabular retractors placed and reaming starts  at 45 mm coursing in increments of 2 to 51 mm and a 52-mm Pinnacle  acetabular shell is placed in about 45 degrees of abduction and 20  degrees of forward flexion.  We had excellent purchase with this cup and  then transfixed it with an additional two dome screws.  The trial 32-mm  neutral +4 liner was placed.   The femur was prepared with the canal finder and irrigation.  Axial  reaming is performed up to 13.5 mm proximal reaming to a 18 F and the  sleeve machined to a large.  A 18 F large  trial sleeve is placed with an  18 x 13 stem and a 36 +8 neck matching native anteversion.  Placed a 32  +0 head first and reduced quite easily so I went to 32 +6.  With the 32  +6 we had fantastic stability with full extension, full external  rotation, 70 degrees flexion, 40 degrees adduction, 90 degrees internal  rotation and 90 degrees of flexion and 70 degrees of internal rotation.  By placing the right leg on top of the left it felt as though leg  lengths were equal.  The hip was then dislocated and all trials were  removed.  The permanent Apex hole eliminator was placed in the  acetabular shell and then the permanent 32 mm neutral +4 Marathon liner  is placed.  On the femoral side we placed the 18 F large sleeve with 18  x 13 stem and 36 +8 neck matching native anteversion.  A 32+6 head is  placed and the hip is reduced to the same stability parameters.  The  wound is then thoroughly irrigated with saline solution.  The short  rotators and capsule then reattached to the  femur through drill holes.  Fascia lata was closed over a Hemovac drain with interrupted #1 Vicryl,  subcu closed with #1-0 and #2-0 Vicryl and subcuticular running 4-0  Monocryl.  Incision is then cleaned and dried and Steri-Strips and bulky  sterile dressing are applied.  The drain is hooked to suction and she is  placed into a knee immobilizer, awakened and transported to recovery in  stable condition.      Ollen Gross, M.D.  Electronically Signed     FA/MEDQ  D:  11/14/2008  T:  11/15/2008  Job:  [id number]

## 2010-09-11 NOTE — H&P (Signed)
NAMEZAMARIA, BRAZZLE NO.:  [id number]   MEDICAL RECORD NO.:  [id number]           PATIENT TYPE:  INP   LOCATION:  1608                         FACILITY:  [facility]   PHYSICIAN:  Ollen Gross, M.D.         DATE OF BIRTH:   DATE OF ADMISSION:  11/14/2008  DATE OF DISCHARGE:                              HISTORY & PHYSICAL   CHIEF COMPLAINT:  Right hip pain.   HISTORY OF PRESENT ILLNESS:  The patient is a 45 year old female who  been known to have congenital hip dysplasia.  She has been seen  preoperatively by Dr. Lequita Halt and found to have severe femoral  dysplasia.  She has gone on to develop significant arthritis associated  with the right hip dysplasia.  It is believed to have progressed too far  to be considered for osteotomy.  It is felt she would benefit from  undergoing for hip replacement.  Risks and benefits have been discussed.  She elects to proceed with surgery.   ALLERGIES:  None known.   CURRENT MEDICATIONS:  Metformin which she is using more for diet issues  and not diabetes, Advil, and tramadol.   PAST MEDICAL HISTORY:  1. History of tinnitus.  2. Past history of childhood asthma.  3. Past history of bronchitis and congenital hip dysplasia      bilaterally.   PAST SURGICAL HISTORY:  1. IVF related procedures and surgeries.  2. Small uterine polyp removed.  3. Two egg retrieval procedures.   FAMILY HISTORY:  Father 69, heart and thyroid issues and hypertension.  Mother 75, diabetes and hypertension.  Has a sister with hypertension  and brother with diabetes.   SOCIAL HISTORY:  Married.  She is a Environmental education officer.  Nonsmoker.  Very rare intake of alcohol.  She does live with family.  She does have caregiver lined up.  She has about 3 or 4 steps entering  on the front and the back porch.   REVIEW OF SYSTEMS:  GENERAL:  No fevers, chills or night sweats.  NEURO:  A little bit of tinnitus and ringing.  No seizures, syncope, or  paralysis.  RESPIRATORY:  No shortness of breath, productive cough, or  hemoptysis.  CARDIOVASCULAR:  No chest pain, angina, or orthopnea.  GI:  Occasional heartburn.  No nausea, vomiting, diarrhea, or constipation.  GU:  No dysuria, hematuria, or discharge.  MUSCULOSKELETAL:  Hip pain.   PHYSICAL EXAMINATION:  VITAL SIGNS:  Pulse 92, respirations 14, blood  pressure 120/72.  GENERAL:  A 45 year old white female well nourished, well developed,  overweight with some hip and thigh obesity.  HEENT: Normocephalic and atraumatic.  Pupils are round and reactive.  EOMs intact.  NECK:  Supple.  No carotid bruits are appreciated.  HEART:  Regular rate and rhythm.  No murmur, S1 and S2 noted.  CHEST:  Clear anterior posterior chest walls.  No rhonchi, rales or  wheezing.  ABDOMEN:  Soft, nontender, protuberant abdomen.  Bowel sounds present.  RESPIRATIONS, BREASTS, AND GENITALIA:  Not done, not pertinent to  present illness.  EXTREMITIES:  Left hip flexion 110 degrees.  Internal rotation 20,  external rotation 30, abduction 30.  Right hip flexion 90, internal  rotation 0, external rotation 10, and abduction 20.   IMPRESSION:  1. Dysplasia, right hip, secondary to osteoarthritis.  2. 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   PLAN:  The patient admitted to [facility] to undergo right  total replacement arthroplasty.  Surgery will be performed by Dr. Ollen Gross.  She has been seen preoperatively by Dr. Evelena Peat.  The  patient states he gave her a verbal clearance to proceed with the  surgery.      Alexzandrew L. Perkins, P.A.C.      Ollen Gross, M.D.  Electronically Signed    ALP/MEDQ  D:  11/15/2008  T:  11/16/2008  Job:  [id number]   cc:   Ollen Gross, M.D.  Fax: [phone]   Evelena Peat, M.D.

## 2010-09-11 NOTE — Discharge Summary (Signed)
NAMEKEIANNA, SIGNER               ACCOUNT NO.:  [id number]   MEDICAL RECORD NO.:  [id number]          PATIENT TYPE:  INP   LOCATION:  1608                         FACILITY:  [facility]   PHYSICIAN:  Ollen Gross, M.D.    DATE OF BIRTH:  Nov 01, 1965   DATE OF ADMISSION:  11/14/2008  DATE OF DISCHARGE:  11/17/2008                               DISCHARGE SUMMARY   ADMITTING DIAGNOSES:  1. Dysplasia right hip with secondary osteoarthritis.  2. Congenital hip dysplasia, left hip.  3. History of tinnitus.  4. Past history of childhood asthma.  5. Past history of bronchitis.   DISCHARGE DIAGNOSES:  1. Congenital hip dysplasia, right hip, status post right total hip      replacement arthroplasty.  2. Postoperative acute blood loss anemia.  3. Status post transfusion without sequelae.  4. Hypokalemia improved.  5. Congenital hip dysplasia, left hip.  6. History of tinnitus.  7. Past history of childhood asthma.  8. Past history of bronchitis.   PROCEDURE:  On November 14, 2008, right total hip.   SURGEON:  Dr. Lequita Halt.   ASSISTANT:  Avel Peace, PA-C.   ANESTHESIA:  General.   CONSULTS:  None.   BRIEF HISTORY:  Abigail [name] is a 45 year old female with severe dysplasia  both hips with secondary degenerative osteoarthritic changes, right more  symptomatic than left, now presents for total hip arthroplasty.   LABORATORY DATA:  Preop CBC showed a hemoglobin of 13.1, hematocrit  39.4, white cell count 8.0, platelets 300.  PT/INR 12.8 and 1.0.  PTT of  31.  Chem panel on admission all within normal limits.  Preop UA was  negative.  Postop hemoglobin down to 8.5, given 2 units postop.  Hemoglobin and hematocrit 9.8 and 29.2.  Last hemoglobin and hematocrit  9.4 and 27.5.  Serial protimes were followed per Coumadin protocol.  Last PT/INR 21.2 and 1.7.  Serial BMETs were followed.  Potassium did  drop from 4.4-3.2 back up to 3.8.  Remaining electrolytes remained  within normal limits.   EKG  dated October 28, 2008, normal sinus rhythm, confirmed Dr. Caryl Never.   X-ray right hip on November 08, 2008, bilateral developmental dysplasia of  the hip secondary to degenerative changes, right greater than left.  No  acute osseous findings.   HOSPITAL COURSE:  The patient admitted to [facility], taken to  the OR and underwent the above-stated procedure without complication.  The patient tolerated the procedure well and later transferred to the  recovery room and orthopedic floor, started PCA and p.o. analgesics,  given 24 hours of postop IV antibiotics.  When seen on day #1,  hemoglobin was down to 8.5.  It was felt she would need blood for  therapy and for endurance.  She had excellent urinary output.  Typed and  crossed for 2 and given 2 units of blood, tolerated blood well.  By day  #2, hemoglobin was already up to 9.8.  She was progressing well with  physical therapy, walking over 30 feet.  Dressing changed on day two and  incision looked good.  Potassium had dropped  down to 3.2 and given oral  potassium supplements.  By day #3, potassium was back up to 3.8.  Hemoglobin was 9.4, tolerating her therapy and discharged home.   DISCHARGE/PLAN:  1. The patient was discharged home on November 17, 2008.  2. Discharge diagnoses:  Please see above.  3. Discharge medications:  Percocet, Robaxin, Nu-Iron and Coumadin.  4. Follow-up in 2 weeks with Dr. Lequita Halt in the office.  5. Activity:  She is partial weightbearing right lower extremity 25-      50%.  Hip precautions, total hip protocol.  Home Health PT and Home      Health nursing.   DISPOSITION:  Home.   CONDITION ON DISCHARGE:  Improved.      Alexzandrew L. Perkins, P.A.C.      Ollen Gross, M.D.  Electronically Signed    ALP/MEDQ  D:  11/17/2008  T:  11/17/2008  Job:  [id number]   cc:   Ollen Gross, M.D.  Fax: [phone]   Evelena Peat, M.D.

## 2010-09-14 NOTE — Assessment & Plan Note (Signed)
[facility]                           GASTROENTEROLOGY OFFICE NOTE   Abigail, [name]                        MRN:          [id number]  DATE:12/11/2005                            DOB:          1965/07/16    CHIEF COMPLAINT:  A 45 year old white female with hematochezia.   HISTORY OF PRESENT ILLNESS:  [name] is a very nice 45 year old white  female that I have seen in the past. She underwent colonoscopy in October of  2002 for hematochezia with findings of small internal hemorrhoids. She has  had several episodes of bright red rectal bleeding that began around July  the 26th and continued to August the 3rd.  It is described as bright red,  generally small in volume but the volume did vary from somewhat heavier  initially to very small amounts in early August.  She did have an episode of  crampy and urgent diarrhea from July 13th to July 16th which was about 8  days before her rectal bleeding began. Since about August 5th she has had no  gastrointestinal complaints whatsoever except for occasional loose stools.  She has had problems with heart burn in the past but these symptoms have  recently been inactive. No change in stool caliber, weight loss, melena,  vomiting, dysphagia or odynophagia. No family history of colon cancer, colon  polyps or inflammatory bowel disease.   PAST MEDICAL HISTORY:  1. Childhood asthma.  2. Osteoarthritis.  3. Internal hemorrhoids.   Medications on the chart have been reviewed.   MEDICATION ALLERGIES:  None known.   SOCIAL HISTORY:  Per diagnostic evaluation form.   REVIEW OF SYSTEMS:  Several areas positive per diagnostic evaluation form.   PHYSICAL EXAMINATION:  GENERAL:  Overweight white female in no acute  distress.  VITAL SIGNS:  Height 5 feet, 6.5 inches, weight 265.6 pounds. Blood pressure  is 126/80, pulse 66 and regular.  HEENT EXAM:  Anicteric sclerae. Oropharynx clear.  CHEST:  Clear to  auscultation bilaterally.  CARDIAC:  Regular rate and rhythm without murmurs appreciated.  ABDOMEN:  Soft, nontender, nondistended, normal active bowel sounds, no  palpable organomegaly, masses or hernias.  RECTAL:  Examination deferred at time of colonoscopy.  EXTREMITIES:  Without clubbing, cyanosis or edema.  NEUROLOGIC:  Alert and oriented x3. Grossly nonfocal.   ASSESSMENT AND PLAN:  Hematochezia. Rule out hemorrhoids, proctitis,  colorectal neoplasms and other etiologies. Risks, benefits and alternatives  to colonoscopy with possible  biopsy, possible polypectomy, possible destruction of internal hemorrhoids  discussed with the patient and she consents to receive. This will be  scheduled electively.                                   Venita Lick. Pleas Koch., MD, Clementeen Graham   MTS/MedQ  DD:  12/20/2005  DT:  12/20/2005  Job #:  [id number]   cc:   Dineen Kid. Rana Snare, MD

## 2011-05-13 ENCOUNTER — Encounter: Payer: Self-pay | Admitting: Gastroenterology

## 2011-05-27 ENCOUNTER — Encounter: Payer: Self-pay | Admitting: Gastroenterology

## 2011-05-27 ENCOUNTER — Ambulatory Visit (INDEPENDENT_AMBULATORY_CARE_PROVIDER_SITE_OTHER): Payer: Managed Care, Other (non HMO) | Admitting: Gastroenterology

## 2011-05-27 VITALS — BP 120/78 | HR 64 | Ht 66.0 in | Wt 266.4 lb

## 2011-05-27 DIAGNOSIS — K219 Gastro-esophageal reflux disease without esophagitis: Secondary | ICD-10-CM

## 2011-05-27 DIAGNOSIS — K920 Hematemesis: Secondary | ICD-10-CM

## 2011-05-27 NOTE — Progress Notes (Signed)
History of Present Illness: This is a 46 year old female with intermittent heartburn. She has heartburn at least 3 times per week. She was treated with omeprazole on a daily basis several months ago which completely controlled her symptoms. She relates that bleeding was noted on removal of the ET tube following hip surgery in March 2012 and she relates that her stool was heme-negative. She was advised to seek GI evaluation for a possible UGI tract source of bleeding following discharge but delayed scheduling the appointment. She underwent colonoscopy in 2007 which was normal. Denies weight loss, abdominal pain, constipation, diarrhea, change in stool caliber, melena, hematochezia, nausea, vomiting, dysphagia, chest pain.   Review of Systems: Pertinent positive and negative review of systems were noted in the above HPI section. She notes sleeping problems and muscle cramps. All other review of systems were otherwise negative.  Current Medications, Allergies, Past Medical History, Past Surgical History, Family History and Social History were reviewed in [employer] record.  Physical Exam: General: Well developed , well nourished, no acute distress Head: Normocephalic and atraumatic Eyes:  sclerae anicteric, EOMI Ears: Normal auditory acuity Mouth: No deformity or lesions Neck: Supple, no masses or thyromegaly Lungs: Clear throughout to auscultation Heart: Regular rate and rhythm; no murmurs, rubs or bruits Abdomen: Soft, non tender and non distended. No masses, hepatosplenomegaly or hernias noted. Normal Bowel sounds Musculoskeletal: Symmetrical with no gross deformities  Skin: No lesions on visible extremities Pulses:  Normal pulses noted Extremities: No clubbing, cyanosis, edema or deformities noted Neurological: Alert oriented x 4, grossly nonfocal Cervical Nodes:  No significant cervical adenopathy Inguinal Nodes: No significant inguinal adenopathy Psychological:   Alert and cooperative. Normal mood and affect  Assessment and Recommendations:  1. GERD-mild. Blood on ET tube-R/O a UGI tract source vs upper airway source of bleeding. Begin standard antireflux measures and Prilosec OTC daily as needed. The risks, benefits, and alternatives to endoscopy with possible biopsy and possible dilation were discussed with the patient and they consent to proceed.

## 2011-05-27 NOTE — Patient Instructions (Signed)
You have been scheduled for a Upper Endoscopy. See separate instructions. Patient advised to avoid spicy, acidic, citrus, chocolate, mints, fruit and fruit juices.  Limit the intake of caffeine, alcohol and Soda.  Don't exercise too soon after eating.  Don't lie down within 3-4 hours of eating.  Elevate the head of your bed. cc: Evelena Peat, MD

## 2011-05-28 ENCOUNTER — Encounter: Payer: Self-pay | Admitting: Gastroenterology

## 2011-05-28 ENCOUNTER — Ambulatory Visit (AMBULATORY_SURGERY_CENTER): Payer: Managed Care, Other (non HMO) | Admitting: Gastroenterology

## 2011-05-28 DIAGNOSIS — K219 Gastro-esophageal reflux disease without esophagitis: Secondary | ICD-10-CM

## 2011-05-28 DIAGNOSIS — K920 Hematemesis: Secondary | ICD-10-CM

## 2011-05-28 MED ORDER — SODIUM CHLORIDE 0.9 % IV SOLN: 500.0000 mL | INTRAVENOUS | Status: DC

## 2011-05-28 MED ORDER — OMEPRAZOLE 20 MG PO CPDR: 20.0000 mg | DELAYED_RELEASE_CAPSULE | Freq: Every day | ORAL | Status: DC

## 2011-05-28 NOTE — Progress Notes (Signed)
Patient did not experience any of the following events: a burn prior to discharge; a fall within the facility; wrong site/side/patient/procedure/implant event; or a hospital transfer or hospital admission upon discharge from the facility. (G8907) Patient did not have preoperative order for IV antibiotic SSI prophylaxis. (G8918)  

## 2011-05-28 NOTE — Op Note (Signed)
[facility] [address] [place], [place]  [address]  ENDOSCOPY PROCEDURE REPORT  PATIENT:  Marciana, Uplinger  MR#:  [id number] BIRTHDATE:  [birth year], 45 yrs. old  GENDER:  female ENDOSCOPIST:  Judie Petit T. Russella Dar, MD, [facility]  PROCEDURE DATE:  05/28/2011 PROCEDURE:  EGD, diagnostic 43235 ASA CLASS:  Class II INDICATIONS:  GERD MEDICATIONS:   These medications were titrated to patient response per physician's verbal order, Fentanyl 50 mcg IV, Versed 6 mg IV TOPICAL ANESTHETIC:  Cetacaine Spray DESCRIPTION OF PROCEDURE:   After the risks benefits and alternatives of the procedure were thoroughly explained, informed consent was obtained.  The LB GIF-H180 [id number] endoscope was introduced through the mouth and advanced to the second portion of the duodenum, without limitations.  The instrument was slowly withdrawn as the mucosa was fully examined. <<PROCEDUREIMAGES>> Esophagitis was found in the distal esophagus. It was erosive and linear arrayed.  LA Class Grade B. Otherwise normal esophagus. The stomach was entered and closely examined. The pylorus, antrum, angularis, and lesser curvature were well visualized, including a retroflexed view of the cardia and fundus. The stomach wall was normally distensable. The scope passed easily through the pylorus into the duodenum.  The duodenal bulb was normal in appearance, as was the postbulbar duodenum.   Retroflexed views revealed a hiatal hernia, small.  The scope was then withdrawn from the patient and the procedure completed.  COMPLICATIONS:  None  ENDOSCOPIC IMPRESSION: 1) Erosive esophagitis 2) Small hiatal hernia  RECOMMENDATIONS: 1) Anti-reflux regimen long term 2) PPI qam: omeprazole 20 mg po qam, #30, 11 refills 3) OP follow-up in 1 year  Heidi Maclin T. Russella Dar, MD, [facility]  n. eSIGNED:   Venita Lick. Gertude Benito at 05/28/2011 03:21 PM  Ulyses Amor, [phone]

## 2011-05-28 NOTE — Patient Instructions (Signed)
FOLLOW DISCHARGE INSTRUCTIONS (BLUE AND GREEN SHEETS).. OMJEPRAZOLE 20 MG EVERY MORNING .

## 2011-05-29 ENCOUNTER — Telehealth: Payer: Self-pay | Admitting: *Deleted

## 2011-05-29 MED ORDER — OMEPRAZOLE 20 MG PO CPDR: 20.0000 mg | DELAYED_RELEASE_CAPSULE | Freq: Every day | ORAL | Status: DC

## 2011-05-29 NOTE — Telephone Encounter (Addendum)
[facility] to ensure [facility] was delivered, confirmed. Informed pt that prescription was sent and that when she runs out she may use prilosec OTC per Dr. Russella Dar, pt husband verbalized understanding. Instructed pt husband to call back if there were any more questions or concerns. Pt husband verbalized understanding. whs, rn

## 2011-05-29 NOTE — Telephone Encounter (Signed)
Yes to both. Please fill her prescription as requested.

## 2011-05-29 NOTE — Telephone Encounter (Signed)
Addended by: Satira Anis on: 05/29/2011 09:56 AM   Modules accepted: Orders

## 2011-05-29 NOTE — Telephone Encounter (Signed)
  Follow up Call-  Call back number 05/28/2011  Post procedure Call Back phone  # [phone]  Permission to leave phone message Yes     Patient questions:  Do you have a fever, pain , or abdominal swelling? no Pain Score  0 *  Have you tolerated food without any problems? yes  Have you been able to return to your normal activities? yes  Do you have any questions about your discharge instructions: Diet   no Medications  yes Follow up visit  no  Do you have questions or concerns about your Care? no  Actions: * If pain score is 4 or above: Physician/ provider Notified : Claudette Head, MD  Pt would like to know if she could have a three month supply of [facility] [address] because her insurance is ending on Feb. 8th. Pt would also like to know if she buys Prilosec OTC when she finishes her three month supply would that be as effective as the prescription, since she will no longer be insured.

## 2012-05-25 ENCOUNTER — Other Ambulatory Visit: Payer: Self-pay

## 2012-05-25 MED ORDER — OMEPRAZOLE 20 MG PO CPDR: 20.0000 mg | DELAYED_RELEASE_CAPSULE | Freq: Every day | ORAL | Status: DC

## 2012-07-30 ENCOUNTER — Telehealth: Payer: Self-pay | Admitting: Family Medicine

## 2012-07-30 NOTE — Telephone Encounter (Signed)
Patient Information:  Caller Name: [name]  Phone: [phone]  Patient: Abigail [name]  Gender: Female  DOB: 1966-01-14  Age: 47 Years  PCP: Evelena Peat [facility])  Pregnant: No  Office Follow Up:  Does the office need to follow up with this patient?: No  Instructions For The Office: N/A   Symptoms  Reason For Call & Symptoms: Pt states she has vomiting and  diarrhea.  Reviewed Health History In EMR: Yes  Reviewed Medications In EMR: Yes  Reviewed Allergies In EMR: Yes  Reviewed Surgeries / Procedures: Yes  Date of Onset of Symptoms: 07/27/2012 OB / GYN:  LMP: 07/28/2012  Guideline(s) Used:  Diarrhea  Disposition Per Guideline:   Home Care  Reason For Disposition Reached:   Mild diarrhea  Advice Given:  Reassurance:  In healthy adults, new-onset diarrhea is usually caused by a viral infection of the intestines, which you can treat at home. Diarrhea is the body's way of getting rid of the infection. Here are some tips on how to keep ahead of the fluid losses.  Here is some care advice that should help.  Fluids:  Drink more fluids, at least 8-10 glasses (8 oz or 240 ml) daily.  For example: sports drinks, diluted fruit juices, soft drinks.  Supplement this with saltine crackers or soups to make certain that you are getting sufficient fluid and salt to meet your body's needs.  Avoid caffeinated beverages (Reason: caffeine is mildly dehydrating).  Nutrition:  Maintaining some food intake during episodes of diarrhea is important.  Ideal initial foods include boiled starches/cereals (e.g., potatoes, rice, noodles, wheat, oats) with a small amount of salt to taste.  Other acceptable foods include: bananas, yogurt, crackers, soup.  As your stools return to normal consistency, resume a normal diet.  Diarrhea Medication  - Imodium AD:   Helps reduce diarrhea.  Adult dosage: 4 mg (2 capsules or 4 teaspoons or 20 ml) is the recommended first dose. You may take an  additional 2 mg (1 capsule or 2 teaspoons or 10 ml) after each loose BM.  Maximum dosage: 16 mg (8 capsules or 16 teaspoons or 80 ml).  Caution: Do not use if you have a fever greater than 100F (37.8C). Do not use if there is blood or mucus in your stools. Do not use for more than 2 days.  Read all package instructions.  Expected Course:  Viral diarrhea lasts 4-7 days. Always worse on days 1 and 2.  Call Back If:  Signs of dehydration occur (e.g., no urine for more than 12 hours, very dry mouth, lightheaded, etc.)  Diarrhea lasts over 7 days  You become worse.  Patient Will Follow Care Advice:  YES

## 2012-08-03 ENCOUNTER — Telehealth: Payer: Self-pay | Admitting: Family Medicine

## 2012-08-03 ENCOUNTER — Encounter: Payer: Self-pay | Admitting: Internal Medicine

## 2012-08-03 ENCOUNTER — Ambulatory Visit (INDEPENDENT_AMBULATORY_CARE_PROVIDER_SITE_OTHER): Payer: 59 | Admitting: Internal Medicine

## 2012-08-03 VITALS — BP 130/86 | HR 72 | Temp 98.5°F | Wt 272.0 lb

## 2012-08-03 DIAGNOSIS — K921 Melena: Secondary | ICD-10-CM

## 2012-08-03 DIAGNOSIS — R197 Diarrhea, unspecified: Secondary | ICD-10-CM

## 2012-08-03 NOTE — Patient Instructions (Signed)
This acts like  Infectious gastroenteritis   And although is lasting a while we are seeing this in the community .   Because of the blood issue also check stool tests and will let you know results.  If getting fever and relapsing sx contact us.  Most  Diarrhea like this should resolve with in 2 weeks of onset .

## 2012-08-03 NOTE — Telephone Encounter (Signed)
Patient Information:  Caller Name: Vinaya [phone])  Phone: [phone]  Patient: Abigail [name], Abigail [name]  Gender: Female  DOB: 02/28/1966  Age: 47 Years  PCP: Evelena Peat (Family Practice)  Pregnant: No  Office Follow Up:  Does the office need to follow up with this patient?: No  Instructions For The Office: N/A  RN Note:  Pt has had Diarrhea since 3-31, Pt took Imodium 3 days, had clear fluids and starchy food w/ no improvement.  Pt noticed blood mixed in w/ stool on 4-6.  Pt has approx 4 episodes per day.  Pt is urinating normally.  Diarrhea protocol used, next available appt scheduled dud to Blood mixed in stool, diarrhea over 7 days.  No appts remaining w/ Dr Caryl Never, appt scheduled w/ Dr Fabian Sharp at 1330 on 4-7.  Pt verbalized understanding.   Symptoms  Reason For Call & Symptoms: ER CALL. Diarrhea f/u  Reviewed Health History In EMR: N/A  Reviewed Medications In EMR: N/A  Reviewed Allergies In EMR: N/A  Reviewed Surgeries / Procedures: N/A  Date of Onset of Symptoms: 07/27/2012  Treatments Tried: Imodium  Treatments Tried Worked: No OB / GYN:  LMP: 07/31/2012  Guideline(s) Used:  Diarrhea  Disposition Per Guideline:   See Today in Office  Reason For Disposition Reached:   Blood in the stool  Advice Given:  N/A  Patient Will Follow Care Advice:  YES  Appointment Scheduled:  08/03/2012 13:30:00 Appointment Scheduled Provider:  Berniece Andreas (Family Practice)

## 2012-08-03 NOTE — Progress Notes (Signed)
Chief Complaint  Patient presents with  . Diarrhea    Started last Monday.  . Emesis    HPI: Patient comes in today for SDA for  new problem evaluation. PCP NA Onset about a week ago with acute onset watery diarrhea and vomiting No one else sick yet.  Husband   with some GI upset Work [employer]. No specific travel or exposure no recent antibiotics Onset  nausea and then diarrhea.    Ha had 2 x this am  .  And collected sample semisoft.  No longer  Wakening her up.  lmp last week .  Taking imodium ad some  ? if helps .   Called the: Nurse noted a small amount of blood mixed in today. ROS: See pertinent positives and negatives per HPI. No fever chills relapsing symptoms. Eating light no fruit juices no unusual bleeding or bleeding disorder  Past Medical History  Diagnosis Date  . Osteoarthritis   . Tinnitus   . Asthma   . Hemorrhoids   . UTI (lower urinary tract infection)   . Bursitis     Family History  Problem Relation Age of Onset  . Hypertension Mother   . Diabetes Mother   . Bipolar disorder Mother   . Breast cancer      maternal great grandmother  . Colon cancer Neg Hx   . Lung cancer Paternal Grandfather   . Prostate cancer Father   . Stroke Maternal Grandfather     History   Social History  . Marital Status: Married    Spouse Name: N/A    Number of Children: 0  . Years of Education: N/A   Occupational History  . Horticulturist, commercial    Social History Main Topics  . Smoking status: Never Smoker   . Smokeless tobacco: Never Used  . Alcohol Use: No  . Drug Use: No  . Sexually Active: None   Other Topics Concern  . None   Social History Narrative  . None    Outpatient Encounter Prescriptions as of 08/03/2012  Medication Sig Dispense Refill  . Ibuprofen-Diphenhydramine HCl (ADVIL PM) 200-25 MG CAPS Take 1 tablet by mouth as needed.      Marland Kitchen omeprazole (PRILOSEC) 20 MG capsule Take 1 capsule (20 mg total) by mouth daily.  30 capsule  0   No  facility-administered encounter medications on file as of 08/03/2012.    EXAM:  BP 130/86  Pulse 72  Temp(Src) 98.5 F (36.9 C) (Oral)  Wt 272 lb (123.378 kg)  BMI 43.92 kg/m2  SpO2 98%  LMP 07/28/2012  Body mass index is 43.92 kg/(m^2).  GENERAL: vitals reviewed and listed above, alert, oriented, appears well hydrated and in no acute distress looks well nontoxic  HEENT: atraumatic, conjunctiva  clear, no obvious abnormalities on inspection of external nose and ears OP : no lesion edema or exudate   NECK: no obvious masses on inspection palpation no adenopathy  LUNGS: clear to auscultation bilaterally, no wheezes, rales or rhonchi, good air movement Skin: normal capillary refill ,turgor , color: No acute rashes ,petechiae or bruising CV: HRRR, no clubbing cyanosis or  peripheral edema nl cap refill  Abdomen soft without organomegaly guarding or rebound bowel sounds somewhat hyperactive no focal tenderness no CVA tenderness MS: moves all extremities without noticeable focal  abnormality PSYCH: pleasant and cooperative, no obvious depression or anxiety  ASSESSMENT AND PLAN:  Discussed the following assessment and plan:  Diarrhea - Probably infectious question of blood sounds  like she is slowly recovering but because of persistence  Blood ;check stool tests and followup - Plan: Clostridium Difficile by PCR, Stool culture, Fecal lactoferrin, Giardia/cryptosporidium (EIA) Has had a colonoscopy in the past -Patient advised to return or notify health care team  if symptoms worsen or persist or new concerns arise.  Patient Instructions  This acts like  Infectious gastroenteritis   And although is lasting a while we are seeing this in the community .   Because of the blood issue also check stool tests and will let you know results.  If getting fever and relapsing sx contact us.  Most  Diarrhea like this should resolve with in 2 weeks of onset .    Neta Mends. Carmita Boom M.D.

## 2012-08-05 LAB — GIARDIA/CRYPTOSPORIDIUM (EIA)
Cryptosporidium Screen (EIA): NEGATIVE
Giardia Screen (EIA): NEGATIVE

## 2012-08-05 LAB — FECAL LACTOFERRIN, QUANT: Lactoferrin: NEGATIVE

## 2012-08-06 ENCOUNTER — Encounter: Payer: Self-pay | Admitting: Internal Medicine

## 2012-08-08 LAB — STOOL CULTURE

## 2012-10-13 ENCOUNTER — Telehealth: Payer: Self-pay | Admitting: Family Medicine

## 2012-10-13 NOTE — Telephone Encounter (Signed)
Patient Information:  Caller Name: Nichol  Phone: [phone]  Patient: Abigail [name]  Gender: Female  DOB: December 18, 1965  Age: 47 Years  PCP: Evelena Peat [facility])  Pregnant: No  Office Follow Up:  Does the office need to follow up with this patient?: No  Instructions For The Office: N/A   Symptoms  Reason For Call & Symptoms: She is wondering if she needs BP med. Has home BP machine and readings have been elevated at times over past few weeks. Most readings have been in the 144/99 range. Last seen in the office in April. She was taking Ibuprofen daily for pain in foot/hip.  Reviewed Health History In EMR: Yes  Reviewed Medications In EMR: Yes  Reviewed Allergies In EMR: Yes  Reviewed Surgeries / Procedures: Yes  Date of Onset of Symptoms: 09/21/2012 OB / GYN:  LMP: 09/22/2012  Guideline(s) Used:  High Blood Pressure  Disposition Per Guideline:   See Within 2 Weeks in Office  Reason For Disposition Reached:   BP > 140/90 and is not taking BP medications  Advice Given:  Call Back If:  Headache, blurred vision, difficulty talking, or difficulty walking occurs  Chest pain or difficulty breathing occurs  You want to go in to the office for a blood pressure check  You become worse.  How To Reduce Your Sodium (Salt) Intake - DO NOT:  Buy or eat heavily salted foods. Examples include pickled foods, salted crackers or chips, and processed meats.  Call Back If:  You have more questions.  How Much Sodium (Salt) Should You Eat Each Day?  Aim to eat less than 1,500 mg of sodium each day.  Remember that one teaspoon of salt has 2,300 mg of sodium.  Unfortunately, 75% of the salt in the average person's diet is in pre-processed foods.  What Is A Healthy Diet?  Eat a variety of vegetables and fruits. Aim for five servings per day.  Eat a variety of grains. Aim for six servings per day.  Eat fish at least two times each week. Fatty fish such as salmon and tuna are  best.  Choose:  Choose reduced-fat dairy skinless poultry and lean meats. These are healthier than red meat.  Choose fats with no more than two grams of saturated fat per tablespoon. Examples include liquid and tub margarine, canola, corn, safflower, and olive oil.  Call Back If:  You have more questions.  Patient Will Follow Care Advice:  YES  Appointment Scheduled:  10/14/2012 13:45:00 Appointment Scheduled Provider:  Evelena Peat [facility])

## 2012-10-14 ENCOUNTER — Encounter: Payer: Self-pay | Admitting: Family Medicine

## 2012-10-14 ENCOUNTER — Ambulatory Visit: Payer: Self-pay | Admitting: Family Medicine

## 2012-10-14 ENCOUNTER — Ambulatory Visit (INDEPENDENT_AMBULATORY_CARE_PROVIDER_SITE_OTHER): Payer: 59 | Admitting: Family Medicine

## 2012-10-14 VITALS — BP 138/80 | HR 113 | Temp 99.4°F | Wt 274.0 lb

## 2012-10-14 DIAGNOSIS — I10 Essential (primary) hypertension: Secondary | ICD-10-CM

## 2012-10-14 LAB — TSH: TSH: 1.34 u[IU]/mL (ref 0.35–5.50)

## 2012-10-14 LAB — CBC WITH DIFFERENTIAL/PLATELET
Basophils Absolute: 0.1 10*3/uL (ref 0.0–0.1)
Basophils Relative: 0.8 % (ref 0.0–3.0)
Hemoglobin: 14.6 g/dL (ref 12.0–15.0)
Lymphocytes Relative: 32.2 % (ref 12.0–46.0)
Monocytes Relative: 9.5 % (ref 3.0–12.0)
Neutro Abs: 5.7 10*3/uL (ref 1.4–7.7)
RBC: 4.94 Mil/uL (ref 3.87–5.11)
RDW: 14.1 % (ref 11.5–14.6)

## 2012-10-14 LAB — BASIC METABOLIC PANEL
CO2: 28 mEq/L (ref 19–32)
Calcium: 8.9 mg/dL (ref 8.4–10.5)
Creatinine, Ser: 0.7 mg/dL (ref 0.4–1.2)
GFR: 94.73 mL/min (ref >60.00)
Sodium: 137 mEq/L (ref 135–145)

## 2012-10-14 MED ORDER — LISINOPRIL 10 MG PO TABS: 10.0000 mg | ORAL_TABLET | Freq: Every day | ORAL | Status: DC

## 2012-10-14 NOTE — Progress Notes (Signed)
  Subjective:    Patient ID: Abigail [name], female    DOB: 1965-05-19, 47 y.o.   MRN: [id number]  HPI Here for elevated BP over the past 3 months. She has a strong family hx of HTN including her brother ans both parents. She feels fine. Her BP at home has averaged 140s over 90s but it has been as high as 189/99 about a week ago. She does not use tobacco.    Review of Systems  Constitutional: Negative.   Respiratory: Negative.   Cardiovascular: Negative.   Neurological: Negative.        Objective:   Physical Exam  Constitutional: She is oriented to person, place, and time. She appears well-developed and well-nourished. No distress.  Neck: No thyromegaly present.  Cardiovascular: Normal rate, regular rhythm, normal heart sounds and intact distal pulses.   Pulmonary/Chest: Effort normal and breath sounds normal.  Musculoskeletal: She exhibits no edema.  Lymphadenopathy:    She has no cervical adenopathy.  Neurological: She is alert and oriented to person, place, and time.          Assessment & Plan:  Start on Lisinopril 10 daily. We discussed reducing sodium intake and losing weight. Get labs. Follow up with Dr. Caryl Never in 3-4 weeks

## 2012-10-16 NOTE — Progress Notes (Signed)
Quick Note:  Pt informed on VM ______ 

## 2012-11-03 ENCOUNTER — Encounter: Payer: Self-pay | Admitting: Family Medicine

## 2012-11-03 ENCOUNTER — Ambulatory Visit (INDEPENDENT_AMBULATORY_CARE_PROVIDER_SITE_OTHER): Payer: 59 | Admitting: Family Medicine

## 2012-11-03 VITALS — BP 128/68 | HR 77 | Temp 98.4°F | Ht 66.0 in | Wt 274.0 lb

## 2012-11-03 DIAGNOSIS — I10 Essential (primary) hypertension: Secondary | ICD-10-CM

## 2012-11-03 LAB — LDL CHOLESTEROL, DIRECT: Direct LDL: 169.7 mg/dL

## 2012-11-03 LAB — BASIC METABOLIC PANEL
CO2: 30 mEq/L (ref 19–32)
Chloride: 105 mEq/L (ref 96–112)
Creatinine, Ser: 0.6 mg/dL (ref 0.4–1.2)

## 2012-11-03 LAB — LIPID PANEL
Total CHOL/HDL Ratio: 5
Triglycerides: 182 mg/dL — ABNORMAL HIGH (ref 0.0–149.0)
VLDL: 36.4 mg/dL (ref 0.0–40.0)

## 2012-11-03 MED ORDER — LISINOPRIL 10 MG PO TABS: 10.0000 mg | ORAL_TABLET | Freq: Every day | ORAL | Status: DC

## 2012-11-03 NOTE — Progress Notes (Signed)
  Subjective:    Patient ID: Abigail [name], female    DOB: 07/01/1965, 47 y.o.   MRN: [id number]  HPI Followup hypertension Patient recently had several blood pressure readings elevated 150s to 160 systolic over 80s diastolic. She had one reading as high as 189/99. Denies any headaches and has felt well. Strong family history of hypertension. She was initiated on lisinopril 10 mg once daily. Blood pressures have gradually improved since then. Mostly 120s over 130s. She denies any cough or any other side effects. No dizziness. No peripheral edema issues. Recent labs including basic metabolic panel and TSH normal. She is requesting lipids as these have not been checked in some time. Patient nonsmoker.  Past Medical History  Diagnosis Date  . Osteoarthritis   . Tinnitus   . Asthma   . Hemorrhoids   . UTI (lower urinary tract infection)   . Bursitis    Past Surgical History  Procedure Laterality Date  . 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 Cervical polypectomy      reports that she has never smoked. She has never used smokeless tobacco. She reports that she does not drink alcohol or use illicit drugs. family history includes Bipolar disorder in her mother; Breast cancer in an unspecified family member; Diabetes in her mother; Hypertension in her mother; Lung cancer in her paternal grandfather; Prostate cancer in her father; and Stroke in her maternal grandfather.  There is no history of Colon cancer. No Known Allergies    Review of Systems  Constitutional: Negative for fatigue.  Eyes: Negative for visual disturbance.  Respiratory: Negative for cough, chest tightness, shortness of breath and wheezing.   Cardiovascular: Negative for chest pain, palpitations and leg swelling.  Neurological: Negative for dizziness, seizures, syncope, weakness, light-headedness and headaches.       Objective:   Physical Exam  Constitutional: She appears well-developed and  well-nourished.  Neck: Neck supple. No thyromegaly present.  Cardiovascular: Normal rate and regular rhythm.  Exam reveals no gallop.   Pulmonary/Chest: Effort normal and breath sounds normal. No respiratory distress. She has no wheezes. She has no rales.  Musculoskeletal: She exhibits no edema.  Lymphadenopathy:    She has no cervical adenopathy.          Assessment & Plan:  Hypertension. Improved. Continue lisinopril. Check basic metabolic panel with recent initiation of ACE inhibitor. Also check lipid panel. Continue weight loss efforts. Avoid nonsteroidals as much as possible.

## 2013-03-04 ENCOUNTER — Other Ambulatory Visit: Payer: Self-pay

## 2013-05-11 ENCOUNTER — Ambulatory Visit: Payer: 59 | Admitting: Family Medicine

## 2013-05-27 ENCOUNTER — Ambulatory Visit: Payer: 59 | Admitting: Family Medicine

## 2013-06-03 ENCOUNTER — Encounter: Payer: Self-pay | Admitting: Family Medicine

## 2013-06-03 ENCOUNTER — Ambulatory Visit (INDEPENDENT_AMBULATORY_CARE_PROVIDER_SITE_OTHER): Payer: BC Managed Care – PPO | Admitting: Family Medicine

## 2013-06-03 VITALS — BP 126/84 | HR 82 | Temp 98.2°F | Wt 282.0 lb

## 2013-06-03 DIAGNOSIS — L659 Nonscarring hair loss, unspecified: Secondary | ICD-10-CM

## 2013-06-03 DIAGNOSIS — I10 Essential (primary) hypertension: Secondary | ICD-10-CM

## 2013-06-03 DIAGNOSIS — R7309 Other abnormal glucose: Secondary | ICD-10-CM

## 2013-06-03 NOTE — Progress Notes (Signed)
   Subjective:    Patient ID: Abigail [name], female    DOB: Jul 23, 1965, 48 y.o.   MRN: [id number]  HPI Patient seen for followup hypertension She's had some steady weight gain. She was concerned about hair loss with lisinopril stopped this on her own recently. She's not been monitoring blood pressures regularly. No headaches. No dizziness. She had recent thyroid functions that were normal. She plans to see dermatologist soon over at [facility] regarding her hair loss. Generally feels well overall. Nonsmoker. No chest pains. No dizziness. No headaches.  She's had previous mildly elevated glucose but no history of diabetes. No symptoms of hyperglycemia  Past Medical History  Diagnosis Date  . Osteoarthritis   . Tinnitus   . Asthma   . Hemorrhoids   . UTI (lower urinary tract infection)   . Bursitis    Past Surgical History  Procedure Laterality Date  . 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 Cervical polypectomy      reports that she has never smoked. She has never used smokeless tobacco. She reports that she does not drink alcohol or use illicit drugs. family history includes Bipolar disorder in her mother; Breast cancer in an other family member; Diabetes in her mother; Hypertension in her mother; Lung cancer in her paternal grandfather; Prostate cancer in her father; Stroke in her maternal grandfather. There is no history of Colon cancer. No Known Allergies    Review of Systems  Constitutional: Negative for appetite change, fatigue and unexpected weight change.  Eyes: Negative for visual disturbance.  Respiratory: Negative for cough, chest tightness, shortness of breath and wheezing.   Cardiovascular: Negative for chest pain, palpitations and leg swelling.  Endocrine: Negative for polydipsia and polyuria.  Neurological: Negative for dizziness, seizures, syncope, weakness, light-headedness and headaches.       Objective:   Physical Exam    Constitutional: She appears well-developed and well-nourished.  Neck: Neck supple. No thyromegaly present.  Cardiovascular: Normal rate and regular rhythm.   Pulmonary/Chest: Effort normal and breath sounds normal. No respiratory distress. She has no wheezes. She has no rales.  Musculoskeletal: She exhibits no edema.          Assessment & Plan:  #1 hypertension. Currently stable off medication. I obtained reading left arm seated large cuff 122/90. We strongly advocate weight loss and not adding additional medication at this time. Routine follow up 6 months #2 history of hyperglycemia. Check blood sugar today which is 4 hours postprandial. Continue weight loss effort above #3 diffuse alopecia. Recent TSH normal. Pending referral to dermatologist.

## 2013-06-03 NOTE — Patient Instructions (Addendum)
Try to lose some weight. Consider free App such as MyFitnessPal to track calories and energy expenditure. I think we should monitor your BP for now and not add additional medications.

## 2013-06-03 NOTE — Progress Notes (Signed)
Pre visit review using our clinic review tool, if applicable. No additional management support is needed unless otherwise documented below in the visit note. 

## 2013-06-04 ENCOUNTER — Telehealth: Payer: Self-pay | Admitting: Family Medicine

## 2013-06-04 NOTE — Telephone Encounter (Signed)
Relevant patient education assigned to patient using Emmi. ° °

## 2013-07-08 ENCOUNTER — Telehealth: Payer: Self-pay | Admitting: Family Medicine

## 2013-07-08 DIAGNOSIS — I10 Essential (primary) hypertension: Secondary | ICD-10-CM

## 2013-07-08 NOTE — Telephone Encounter (Signed)
Yes. Make sure we know specifically which labs to get.

## 2013-07-08 NOTE — Telephone Encounter (Signed)
Pt needs health screening form filled out for pt's insurance.  It requires cpx labs. Pt just seen for 6 mo fu in feb. Pt would like to know if she could have labs only and the form filled out. (before March 31) pls advise

## 2013-07-08 NOTE — Telephone Encounter (Signed)
Is it okay to order labs for the patient

## 2013-07-09 NOTE — Telephone Encounter (Signed)
Left message for patient to return call.

## 2013-07-12 NOTE — Telephone Encounter (Signed)
Labs are ordered 

## 2013-07-15 ENCOUNTER — Other Ambulatory Visit (INDEPENDENT_AMBULATORY_CARE_PROVIDER_SITE_OTHER): Payer: BC Managed Care – PPO

## 2013-07-15 DIAGNOSIS — I10 Essential (primary) hypertension: Secondary | ICD-10-CM

## 2013-07-15 LAB — BASIC METABOLIC PANEL
BUN: 10 mg/dL (ref 6–23)
CO2: 25 meq/L (ref 19–32)
Calcium: 8.7 mg/dL (ref 8.4–10.5)
Chloride: 104 mEq/L (ref 96–112)
Creatinine, Ser: 0.6 mg/dL (ref 0.4–1.2)
GFR: 110.4 mL/min (ref >60.00)
GLUCOSE: 98 mg/dL (ref 70–99)
POTASSIUM: 4 meq/L (ref 3.5–5.1)
SODIUM: 138 meq/L (ref 135–145)

## 2013-07-15 LAB — LIPID PANEL
CHOL/HDL RATIO: 5
Cholesterol: 210 mg/dL — ABNORMAL HIGH (ref 0–200)
HDL: 40.2 mg/dL (ref >39.00)
LDL Cholesterol: 148 mg/dL — ABNORMAL HIGH (ref 0–99)
Triglycerides: 110 mg/dL (ref 0.0–149.0)
VLDL: 22 mg/dL (ref 0.0–40.0)

## 2014-01-17 ENCOUNTER — Telehealth: Payer: Self-pay | Admitting: Family Medicine

## 2014-01-17 NOTE — Telephone Encounter (Addendum)
Pt request refill tramadol for pain in her knuckles. Pt has had 2 hip replacements and having occasional pain.  Would llike a rx for tramadol.  [facility] .

## 2014-01-19 MED ORDER — TRAMADOL HCL 50 MG PO TABS: ORAL_TABLET | ORAL | Status: DC

## 2014-01-19 NOTE — Telephone Encounter (Signed)
Called in prescription

## 2014-01-19 NOTE — Telephone Encounter (Signed)
Last visit 06/03/13  Pt is not currently taking medications. And is not in medication history.

## 2014-01-19 NOTE — Telephone Encounter (Signed)
Tramadol 50 mg 1-2 po q 6 hours prn pain #60

## 2014-07-25 ENCOUNTER — Ambulatory Visit (INDEPENDENT_AMBULATORY_CARE_PROVIDER_SITE_OTHER): Payer: BLUE CROSS/BLUE SHIELD | Admitting: Family Medicine

## 2014-07-25 ENCOUNTER — Encounter: Payer: Self-pay | Admitting: Family Medicine

## 2014-07-25 VITALS — BP 120/84 | HR 68 | Temp 98.9°F | Ht 67.0 in | Wt 272.0 lb

## 2014-07-25 DIAGNOSIS — Z0000 Encounter for general adult medical examination without abnormal findings: Secondary | ICD-10-CM

## 2014-07-25 LAB — LIPID PANEL
CHOL/HDL RATIO: 4
CHOLESTEROL: 191 mg/dL (ref 0–200)
HDL: 46.7 mg/dL (ref >39.00)
LDL Cholesterol: 123 mg/dL — ABNORMAL HIGH (ref 0–99)
NonHDL: 144.3
Triglycerides: 107 mg/dL (ref 0.0–149.0)
VLDL: 21.4 mg/dL (ref 0.0–40.0)

## 2014-07-25 LAB — CBC WITH DIFFERENTIAL/PLATELET
BASOS ABS: 0 10*3/uL (ref 0.0–0.1)
Basophils Relative: 0.7 % (ref 0.0–3.0)
EOS ABS: 0.2 10*3/uL (ref 0.0–0.7)
EOS PCT: 3.4 % (ref 0.0–5.0)
HCT: 41.4 % (ref 36.0–46.0)
Hemoglobin: 13.8 g/dL (ref 12.0–15.0)
LYMPHS PCT: 34.1 % (ref 12.0–46.0)
Lymphs Abs: 2.3 10*3/uL (ref 0.7–4.0)
MCHC: 33.4 g/dL (ref 30.0–36.0)
MCV: 86.7 fl (ref 78.0–100.0)
MONOS PCT: 10.7 % (ref 3.0–12.0)
Monocytes Absolute: 0.7 10*3/uL (ref 0.1–1.0)
NEUTROS PCT: 51.1 % (ref 43.0–77.0)
Neutro Abs: 3.4 10*3/uL (ref 1.4–7.7)
PLATELETS: 317 10*3/uL (ref 150.0–400.0)
RBC: 4.78 Mil/uL (ref 3.87–5.11)
RDW: 14.2 % (ref 11.5–15.5)
WBC: 6.7 10*3/uL (ref 4.0–10.5)

## 2014-07-25 LAB — HEPATIC FUNCTION PANEL
ALK PHOS: 59 U/L (ref 39–117)
ALT: 17 U/L (ref 0–35)
AST: 15 U/L (ref 0–37)
Albumin: 4 g/dL (ref 3.5–5.2)
Bilirubin, Direct: 0.1 mg/dL (ref 0.0–0.3)
Total Bilirubin: 0.7 mg/dL (ref 0.2–1.2)
Total Protein: 7.2 g/dL (ref 6.0–8.3)

## 2014-07-25 LAB — BASIC METABOLIC PANEL
BUN: 12 mg/dL (ref 6–23)
CO2: 28 mEq/L (ref 19–32)
Calcium: 9.1 mg/dL (ref 8.4–10.5)
Chloride: 105 mEq/L (ref 96–112)
Creatinine, Ser: 0.66 mg/dL (ref 0.40–1.20)
GFR: 102.25 mL/min (ref >60.00)
Glucose, Bld: 101 mg/dL — ABNORMAL HIGH (ref 70–99)
POTASSIUM: 4.6 meq/L (ref 3.5–5.1)
SODIUM: 138 meq/L (ref 135–145)

## 2014-07-25 LAB — TSH: TSH: 2.37 u[IU]/mL (ref 0.35–4.50)

## 2014-07-25 NOTE — Patient Instructions (Signed)
Continue with weight loss efforts and more consistent exercise. I would go ahead and start the [place].

## 2014-07-25 NOTE — Progress Notes (Signed)
Pre visit review using our clinic review tool, if applicable. No additional management support is needed unless otherwise documented below in the visit note. 

## 2014-07-25 NOTE — Progress Notes (Signed)
   Subjective:    Patient ID: Abigail [name], female    DOB: 12-Apr-1966, 49 y.o.   MRN: [id number]  HPI Patient seen for complete physical. She sees gynecologist yearly. She's had some problems with diffuse alopecia over the past year and is currently seen dermatologist. She was placed on ketoconazole shampoo and Aldactone which she has not yet started. She took her self off lisinopril recently with concerns this was causing hair loss. Blood pressure varies considerably but she has had some elevated diastolics over 90 recently. Tetanus is up-to-date. She is lost about 11 pounds since last summer due to dietary changes. Plans to start exercising more soon.  Past Medical History  Diagnosis Date  . Osteoarthritis   . Tinnitus   . Asthma   . Hemorrhoids   . UTI (lower urinary tract infection)   . Bursitis    Past Surgical History  Procedure Laterality Date  . 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 Cervical polypectomy      reports that she has never smoked. She has never used smokeless tobacco. She reports that she does not drink alcohol or use illicit drugs. family history includes Bipolar disorder in her mother; Breast cancer in an other family member; Diabetes in her mother; Hypertension in her mother; Lung cancer in her paternal grandfather; Prostate cancer in her father; Stroke in her maternal grandfather. There is no history of Colon cancer. No Known Allergies    Review of Systems  Constitutional: Negative for fever, activity change, appetite change, fatigue and unexpected weight change.  HENT: Negative for ear pain, hearing loss, sore throat and trouble swallowing.   Eyes: Negative for visual disturbance.  Respiratory: Negative for cough and shortness of breath.   Cardiovascular: Negative for chest pain and palpitations.  Gastrointestinal: Negative for abdominal pain, diarrhea, constipation and blood in stool.  Endocrine: Negative for polydipsia and polyuria.    Genitourinary: Negative for dysuria and hematuria.  Musculoskeletal: Positive for arthralgias. Negative for myalgias and back pain.  Skin: Negative for rash.  Neurological: Negative for dizziness, syncope and headaches.  Hematological: Negative for adenopathy.  Psychiatric/Behavioral: Negative for confusion and dysphoric mood.       Objective:   Physical Exam  Constitutional: She is oriented to person, place, and time. She appears well-developed and well-nourished.  HENT:  Head: Normocephalic and atraumatic.  Eyes: EOM are normal. Pupils are equal, round, and reactive to light.  Neck: Normal range of motion. Neck supple. No thyromegaly present.  Cardiovascular: Normal rate, regular rhythm and normal heart sounds.   No murmur heard. Pulmonary/Chest: Breath sounds normal. No respiratory distress. She has no wheezes. She has no rales.  Abdominal: Soft. Bowel sounds are normal. She exhibits no distension and no mass. There is no tenderness. There is no rebound and no guarding.  Genitourinary:  Per GYN  Musculoskeletal: Normal range of motion. She exhibits no edema.  Lymphadenopathy:    She has no cervical adenopathy.  Neurological: She is alert and oriented to person, place, and time. She displays normal reflexes. No cranial nerve deficit.  Skin: No rash noted.  Psychiatric: She has a normal mood and affect. Her behavior is normal. Judgment and thought content normal.          Assessment & Plan:  Complete physical. Order screening labs. We spent some time discussing weight loss strategies. Establish more consistent exercise. She will continue with GYN follow-up.

## 2015-08-16 ENCOUNTER — Telehealth: Payer: Self-pay | Admitting: Gastroenterology

## 2015-08-16 NOTE — Telephone Encounter (Signed)
Patient reports 3 days of loose stools despite multiple doses of imodium.  She reports more than 10 stools in the last 24 hours.  She is also having uppper abdominal pain.  She will come in and see Alonza Bogus, PA tomorrow at 2:00

## 2015-08-17 ENCOUNTER — Ambulatory Visit (INDEPENDENT_AMBULATORY_CARE_PROVIDER_SITE_OTHER): Payer: 59 | Admitting: Gastroenterology

## 2015-08-17 ENCOUNTER — Encounter: Payer: Self-pay | Admitting: Gastroenterology

## 2015-08-17 ENCOUNTER — Other Ambulatory Visit: Payer: 59

## 2015-08-17 VITALS — BP 132/68 | Ht 66.5 in | Wt 277.0 lb

## 2015-08-17 DIAGNOSIS — K219 Gastro-esophageal reflux disease without esophagitis: Secondary | ICD-10-CM | POA: Diagnosis not present

## 2015-08-17 DIAGNOSIS — R1013 Epigastric pain: Secondary | ICD-10-CM | POA: Diagnosis not present

## 2015-08-17 DIAGNOSIS — A09 Infectious gastroenteritis and colitis, unspecified: Secondary | ICD-10-CM | POA: Diagnosis not present

## 2015-08-17 DIAGNOSIS — R197 Diarrhea, unspecified: Secondary | ICD-10-CM

## 2015-08-17 MED ORDER — OMEPRAZOLE 20 MG PO CPDR: 20.0000 mg | DELAYED_RELEASE_CAPSULE | Freq: Every day | ORAL | Status: DC

## 2015-08-17 NOTE — Patient Instructions (Signed)
We have sent the following medications to your pharmacy for you to pick up at your convenience: omeprazole 20 mg twice daily.   Your physician has requested that you go to the basement for lab work before leaving today.  You can start over the counter FD Guard daily.

## 2015-08-17 NOTE — Progress Notes (Signed)
08/17/2015 Abigail [name] [id number] [birth year]   HISTORY OF PRESENT ILLNESS:  This is a 50 year old female who is known to Dr. Fuller Plan. Her last colonoscopy was in September 2007 at which time the study was normal.  She had an EGD in January 2013 that showed erosive esophagitis and a small hiatal hernia. She is on omeprazole 20 mg daily currently.  She presents to our office today primarily with complaints of acute diarrhea. This began with sudden onset on Monday and has continued. Has profuse watery stools. Says that she had 10-12 stools yesterday. Has been taking Imodium with no improvement. She denies any recent antibiotics use, travel, sick contacts, or bad food exposure. Never had any similar episodes of this in the past. Denies any associated abdominal pain, nausea, vomiting, fever, or chills.  While she is here she also mentions some epigastric abdominal pains that occur intermittently for the past 6-8 months. She also feels like her heartburn and reflux have been more frequent as well. As stated above, she is on omeprazole 20 mg daily.   Past Medical History  Diagnosis Date  . Osteoarthritis   . Tinnitus   . Asthma   . Hemorrhoids   . UTI (lower urinary tract infection)   . Bursitis    Past Surgical History  Procedure Laterality Date  . 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 Cervical polypectomy      reports that she has never smoked. She has never used smokeless tobacco. She reports that she does not drink alcohol or use illicit drugs. family history includes Bipolar disorder in her mother; Diabetes in her mother; Hypertension in her mother; Lung cancer in her paternal grandfather; Prostate cancer in her father; Stroke in her maternal grandfather. There is no history of Colon cancer. No Known Allergies    Outpatient Encounter Prescriptions as of 08/17/2015  Medication Sig  . Biotin 10 MG CAPS Take 1 capsule by mouth daily.  Marland Kitchen ibuprofen (ADVIL) 200 MG  tablet Take 200 mg by mouth as needed.  Marland Kitchen ketoconazole (NIZORAL) 2 % shampoo   . Lysine 500 MG CAPS Take 1 capsule by mouth daily.  . Probiotic Product (PROBIOTIC DAILY) CAPS Take 1 capsule by mouth daily.  Marland Kitchen spironolactone (ALDACTONE) 25 MG tablet Take 25 mg by mouth daily.  Marland Kitchen omeprazole (PRILOSEC) 20 MG capsule Take 1 capsule (20 mg total) by mouth daily.  . [DISCONTINUED] omeprazole (PRILOSEC) 20 MG capsule Take 1 capsule (20 mg total) by mouth daily.  . [DISCONTINUED] traMADol (ULTRAM) 50 MG tablet Take 1-2 tablets by mouth every 6 hours as needed for pain. (Patient not taking: Reported on 07/25/2014)   No facility-administered encounter medications on file as of 08/17/2015.   REVIEW OF SYSTEMS  : All other systems reviewed and negative except where noted in the History of Present Illness.  PHYSICAL EXAM: BP 132/68 mmHg  Ht 5' 6.5" (1.689 m)  Wt 277 lb (125.646 kg)  BMI 44.04 kg/m2 General: Well developed white female in no acute distress Head: Normocephalic and atraumatic Eyes:  Sclerae anicteric, conjunctiva pink. Ears: Normal auditory acuity Lungs: Clear throughout to auscultation Heart: Regular rate and rhythm Abdomen: Soft, non-distended.  Normal bowel sounds.  Non-tender. Musculoskeletal: Symmetrical with no gross deformities  Skin: No lesions on visible extremities Extremities: No edema  Neurological: Alert oriented x 4, grossly non-focal Psychological:  Alert and cooperative. Normal mood and affect  ASSESSMENT AND PLAN: -Diarrhea:  Acute x 3 days.  Presumed infectious.  Will check stool studies including Cdiff PCR, stool culture, and O&P.  If negative and diarrhea continues then could consider a 7 day course of Flagyl empirically.  Discussed low-residue diet for now. -Epigastric pain, intermittent for the past several months:  Has history of GERD and esophagitis on EGD 4 years ago.  She's had an increase in her reflux symptoms recently as well.  Will increase her omeprazole  to 20 mg BID to see if this helps her reflux and upper/epigastric abdominal pain.  Will follow-up in 4-8 weeks regarding these symptoms.  CC:  Eulas Post, MD

## 2015-08-18 LAB — OVA AND PARASITE EXAMINATION: OP: NONE SEEN

## 2015-08-18 LAB — CLOSTRIDIUM DIFFICILE BY PCR: Toxigenic C. Difficile by PCR: NOT DETECTED

## 2015-08-18 NOTE — Progress Notes (Signed)
Reviewed and agree with initial management plan.  Tylen Leverich T. Schneider Warchol, MD FACG 

## 2015-08-19 ENCOUNTER — Encounter: Payer: Self-pay | Admitting: Gastroenterology

## 2015-08-21 LAB — STOOL CULTURE

## 2015-08-22 ENCOUNTER — Telehealth: Payer: Self-pay | Admitting: *Deleted

## 2015-08-22 MED ORDER — METRONIDAZOLE 500 MG PO TABS: ORAL_TABLET | ORAL | Status: DC

## 2015-08-22 NOTE — Telephone Encounter (Signed)
Please let her know that her stool studies are all negative. We can empirically treat her with a 7 day course of Flagyl if she is continuing to have diarrhea. If interested, we can give her Flagyl 500 mg twice a day for 7 days. Should continue to follow the Burtrum for now as well. Should not drink alcohol while taking the Flagyl or for 3 days following the use of Flagyl. Should begin taking a daily probiotic as well such as Florastor.        Thank you,        Jess        ----- Message -----     From: Don Broach     Sent: 08/21/2015  8:09 AM      To: Loralie Champagne, PA-C    Subject: Visit Follow-Up Question                   ----- Message from Hulan Saas, RN sent at 08/21/2015 8:09 AM EDT -----            ----- Message from Don Broach to Loralie Champagne, PA-C sent at 08/19/2015 8:25 AM -----     Dr. Myrtice Lauth,     I just wanted to give an update from our Thursday appointment.         Left a message for patient to call back.

## 2015-08-22 NOTE — Telephone Encounter (Signed)
Spoke with patient and she states over the weekend she had back pain and took pain medication. She became constipated due to this. She took a dose of Miralax and had a loose stool then diarrhea. She states she hopes that she will get back to normal now but wants to have the Flagyl called in just in case she continues to have diarrhea. Rx sent.

## 2015-10-16 ENCOUNTER — Ambulatory Visit: Payer: 59 | Admitting: Gastroenterology

## 2015-12-13 ENCOUNTER — Other Ambulatory Visit: Payer: Self-pay

## 2015-12-13 MED ORDER — OMEPRAZOLE 20 MG PO CPDR: 20.0000 mg | DELAYED_RELEASE_CAPSULE | Freq: Every day | ORAL | 1 refills | Status: DC

## 2015-12-14 ENCOUNTER — Telehealth: Payer: Self-pay | Admitting: Gastroenterology

## 2015-12-14 NOTE — Telephone Encounter (Signed)
A user error has taken place: ERROR °

## 2016-06-14 ENCOUNTER — Other Ambulatory Visit (INDEPENDENT_AMBULATORY_CARE_PROVIDER_SITE_OTHER): Payer: BLUE CROSS/BLUE SHIELD

## 2016-06-14 DIAGNOSIS — Z0000 Encounter for general adult medical examination without abnormal findings: Secondary | ICD-10-CM | POA: Diagnosis not present

## 2016-06-14 LAB — HEPATIC FUNCTION PANEL
ALT: 21 U/L (ref 0–35)
AST: 15 U/L (ref 0–37)
Albumin: 4.1 g/dL (ref 3.5–5.2)
Alkaline Phosphatase: 61 U/L (ref 39–117)
BILIRUBIN DIRECT: 0.1 mg/dL (ref 0.0–0.3)
BILIRUBIN TOTAL: 0.6 mg/dL (ref 0.2–1.2)
TOTAL PROTEIN: 6.9 g/dL (ref 6.0–8.3)

## 2016-06-14 LAB — BASIC METABOLIC PANEL
BUN: 15 mg/dL (ref 6–23)
CHLORIDE: 105 meq/L (ref 96–112)
CO2: 27 meq/L (ref 19–32)
CREATININE: 0.63 mg/dL (ref 0.40–1.20)
Calcium: 9.2 mg/dL (ref 8.4–10.5)
GFR: 107.02 mL/min (ref >60.00)
GLUCOSE: 102 mg/dL — AB (ref 70–99)
POTASSIUM: 4.6 meq/L (ref 3.5–5.1)
Sodium: 138 mEq/L (ref 135–145)

## 2016-06-14 LAB — CBC WITH DIFFERENTIAL/PLATELET
BASOS PCT: 1 % (ref 0.0–3.0)
Basophils Absolute: 0.1 10*3/uL (ref 0.0–0.1)
EOS ABS: 0.3 10*3/uL (ref 0.0–0.7)
EOS PCT: 4.7 % (ref 0.0–5.0)
HCT: 41.6 % (ref 36.0–46.0)
HEMOGLOBIN: 13.9 g/dL (ref 12.0–15.0)
LYMPHS ABS: 2.3 10*3/uL (ref 0.7–4.0)
Lymphocytes Relative: 33.8 % (ref 12.0–46.0)
MCHC: 33.3 g/dL (ref 30.0–36.0)
MCV: 88.1 fl (ref 78.0–100.0)
MONO ABS: 0.7 10*3/uL (ref 0.1–1.0)
Monocytes Relative: 10 % (ref 3.0–12.0)
NEUTROS ABS: 3.5 10*3/uL (ref 1.4–7.7)
Neutrophils Relative %: 50.5 % (ref 43.0–77.0)
PLATELETS: 329 10*3/uL (ref 150.0–400.0)
RBC: 4.72 Mil/uL (ref 3.87–5.11)
RDW: 14.2 % (ref 11.5–15.5)
WBC: 6.9 10*3/uL (ref 4.0–10.5)

## 2016-06-14 LAB — LIPID PANEL
CHOLESTEROL: 204 mg/dL — AB (ref 0–200)
HDL: 40.3 mg/dL (ref >39.00)
LDL CALC: 132 mg/dL — AB (ref 0–99)
NonHDL: 163.57
Total CHOL/HDL Ratio: 5
Triglycerides: 158 mg/dL — ABNORMAL HIGH (ref 0.0–149.0)
VLDL: 31.6 mg/dL (ref 0.0–40.0)

## 2016-06-14 LAB — TSH: TSH: 2.64 u[IU]/mL (ref 0.35–4.50)

## 2016-06-18 ENCOUNTER — Ambulatory Visit (INDEPENDENT_AMBULATORY_CARE_PROVIDER_SITE_OTHER): Payer: BLUE CROSS/BLUE SHIELD | Admitting: Family Medicine

## 2016-06-18 ENCOUNTER — Encounter: Payer: Self-pay | Admitting: Family Medicine

## 2016-06-18 VITALS — BP 122/92 | HR 95 | Ht 66.0 in | Wt 287.0 lb

## 2016-06-18 DIAGNOSIS — Z0000 Encounter for general adult medical examination without abnormal findings: Secondary | ICD-10-CM | POA: Diagnosis not present

## 2016-06-18 NOTE — Progress Notes (Signed)
Pre visit review using our clinic review tool, if applicable. No additional management support is needed unless otherwise documented below in the visit note. 

## 2016-06-18 NOTE — Progress Notes (Signed)
Subjective:     Patient ID: Abigail [name], female   DOB: [birth year], 51 y.o.   MRN: [id number]  HPI Patient seen for complete physical. She has history of obesity, GERD, alopecia. She took spirinolactone briefly last year per dermatologist. She is also using Rogaine and she feels like her hair loss has stabilized. She is currently caring for her mother who has several health issues. She plans to set up GYN follow-up soon. Tetanus up-to-date. Overdue for mammogram and Pap smear.  History of prediabetes. No polyuria or polydipsia. She recently purchased an elliptical trainer but has not started on using this yet. She's had prior bilateral hip replacements  Past Medical History:  Diagnosis Date  . Asthma   . Bursitis   . Hemorrhoids   . Osteoarthritis   . Tinnitus   . UTI (lower urinary tract infection)    Past Surgical History:  Procedure Laterality Date  . CERVICAL POLYPECTOMY    . TOTAL HIP ARTHROPLASTY  10/2008 & 06/2010   bilateral    reports that she has never smoked. She has never used smokeless tobacco. She reports that she does not drink alcohol or use drugs. family history includes Bipolar disorder in her mother; Diabetes in her mother; Hypertension in her mother; Lung cancer in her paternal grandfather; Prostate cancer in her father; Stroke in her maternal grandfather. No Known Allergies   Review of Systems  Constitutional: Negative for activity change, appetite change, fatigue, fever and unexpected weight change.  HENT: Negative for ear pain, hearing loss, sore throat and trouble swallowing.   Eyes: Negative for visual disturbance.  Respiratory: Negative for cough and shortness of breath.   Cardiovascular: Negative for chest pain and palpitations.  Gastrointestinal: Negative for abdominal pain, blood in stool, constipation and diarrhea.  Genitourinary: Negative for dysuria and hematuria.  Musculoskeletal: Positive for arthralgias. Negative for back pain and myalgias.   Skin: Negative for rash.  Neurological: Negative for dizziness, syncope and headaches.  Hematological: Negative for adenopathy.  Psychiatric/Behavioral: Negative for confusion and dysphoric mood.       Objective:   Physical Exam  Constitutional: She is oriented to person, place, and time. She appears well-developed and well-nourished.  HENT:  Head: Normocephalic and atraumatic.  Eyes: EOM are normal. Pupils are equal, round, and reactive to light.  Neck: Normal range of motion. Neck supple. No thyromegaly present.  Cardiovascular: Normal rate, regular rhythm and normal heart sounds.   No murmur heard. Pulmonary/Chest: Breath sounds normal. No respiratory distress. She has no wheezes. She has no rales.  Abdominal: Soft. Bowel sounds are normal. She exhibits no distension and no mass. There is no tenderness. There is no rebound and no guarding.  Musculoskeletal: Normal range of motion. She exhibits no edema.  Lymphadenopathy:    She has no cervical adenopathy.  Neurological: She is alert and oriented to person, place, and time. She displays normal reflexes. No cranial nerve deficit.  Skin: No rash noted.  Psychiatric: She has a normal mood and affect. Her behavior is normal. Judgment and thought content normal.       Assessment:     Physical exam. Labs reviewed. She has elevated glucose 102 along with dyslipidemia.  Borderline elevated blood pressure.    Plan:     -Strongly advise weight loss and strategies discussed -We recommended starting back more consistent exercise with the elliptical -Handout on food choices to help lower triglycerides -Follow-up in 3 months and reassess blood pressure down and bring her cuff with home  readings to compare  Eulas Post MD [facility]

## 2016-06-18 NOTE — Progress Notes (Signed)
Subjective:     Patient ID: Abigail [name], female   DOB: [birth year], 51 y.o.   MRN: [id number]  HPI  Pt is here for CPE.  She sees GYN yearly.    Review of Systems     Objective:   Physical Exam     Assessment:         Plan:

## 2016-06-18 NOTE — Patient Instructions (Signed)

## 2016-07-01 ENCOUNTER — Other Ambulatory Visit: Payer: Self-pay | Admitting: *Deleted

## 2016-07-01 MED ORDER — OMEPRAZOLE 20 MG PO CPDR: 20.0000 mg | DELAYED_RELEASE_CAPSULE | Freq: Every day | ORAL | 1 refills | Status: DC

## 2016-07-10 ENCOUNTER — Telehealth: Payer: Self-pay | Admitting: Family Medicine

## 2016-07-10 MED ORDER — OMEPRAZOLE 20 MG PO CPDR: 20.0000 mg | DELAYED_RELEASE_CAPSULE | Freq: Every day | ORAL | 1 refills | Status: DC

## 2016-07-10 NOTE — Telephone Encounter (Signed)
Pt does not have [facility] . Pt needs new rx omeprazole 20 mg #90 w/refills  [facility]

## 2016-09-17 ENCOUNTER — Ambulatory Visit (INDEPENDENT_AMBULATORY_CARE_PROVIDER_SITE_OTHER): Payer: BLUE CROSS/BLUE SHIELD | Admitting: Family Medicine

## 2016-09-17 ENCOUNTER — Encounter: Payer: Self-pay | Admitting: Family Medicine

## 2016-09-17 VITALS — BP 132/80 | HR 61 | Temp 98.6°F | Wt 281.6 lb

## 2016-09-17 DIAGNOSIS — I10 Essential (primary) hypertension: Secondary | ICD-10-CM

## 2016-09-17 MED ORDER — SPIRONOLACTONE 25 MG PO TABS: 25.0000 mg | ORAL_TABLET | Freq: Every day | ORAL | 3 refills | Status: DC

## 2016-09-17 NOTE — Progress Notes (Signed)
Subjective:     Patient ID: Abigail [name], female   DOB: [birth year], 51 y.o.   MRN: [id number]  HPI Patient seen for follow-up regarding hypertension. Borderline elevations. She has lost about 6 pounds since last visit. She is using elliptical about 3 days per week and walking some most other days. She previously took lisinopril but thinks is causing increased alopecia. She saw dermatologist who treated her briefly with Aldactone. She is still had some readings as high as 494 systolic but they vary considerably and several readings have been below 130. She has occasional malaise. No consistent headaches. No chest pain. She is watching her sodium intake more closely.  Past Medical History:  Diagnosis Date  . Asthma   . Bursitis   . Hemorrhoids   . Osteoarthritis   . Tinnitus   . UTI (lower urinary tract infection)    Past Surgical History:  Procedure Laterality Date  . CERVICAL POLYPECTOMY    . TOTAL HIP ARTHROPLASTY  10/2008 & 06/2010   bilateral    reports that she has never smoked. She has never used smokeless tobacco. She reports that she does not drink alcohol or use drugs. family history includes Bipolar disorder in her mother; Diabetes in her mother; Hypertension in her mother; Lung cancer in her paternal grandfather; Prostate cancer in her father; Stroke in her maternal grandfather. No Known Allergies   Review of Systems  Eyes: Negative for visual disturbance.  Respiratory: Negative for cough, chest tightness, shortness of breath and wheezing.   Cardiovascular: Negative for chest pain, palpitations and leg swelling.  Endocrine: Negative for polydipsia and polyuria.  Neurological: Negative for dizziness, seizures, syncope, weakness, light-headedness and headaches.       Objective:   Physical Exam  Constitutional: She appears well-developed and well-nourished.  Eyes: Pupils are equal, round, and reactive to light.  Neck: Neck supple. No JVD present. No thyromegaly present.   Cardiovascular: Normal rate and regular rhythm.  Exam reveals no gallop.   Pulmonary/Chest: Effort normal and breath sounds normal. No respiratory distress. She has no wheezes. She has no rales.  Musculoskeletal: She exhibits no edema.  Neurological: She is alert.       Assessment:     Hypertension. Readings vary considerably. We had slightly elevated readings by her machine today and there around 90 diastolic by her readings in 496P systolic    Plan:     -Continue with weight loss efforts and regular aerobic exercise -Continue to watch sodium intake closely -We discussed initiating Aldactone 25 mg once daily-which she tolerated well in the past -Reassess in one month and check basic metabolic panel and to assess her potassium status -Suggested calorie tracking application and setting daily calorie goal  Eulas Post MD [facility] at [facility]

## 2017-01-03 ENCOUNTER — Other Ambulatory Visit: Payer: Self-pay | Admitting: Family Medicine

## 2017-01-16 ENCOUNTER — Encounter: Payer: Self-pay | Admitting: Family Medicine

## 2017-06-16 ENCOUNTER — Other Ambulatory Visit: Payer: Self-pay | Admitting: Family Medicine

## 2017-06-20 ENCOUNTER — Encounter: Payer: Self-pay | Admitting: Family Medicine

## 2017-06-20 ENCOUNTER — Ambulatory Visit (INDEPENDENT_AMBULATORY_CARE_PROVIDER_SITE_OTHER): Payer: BLUE CROSS/BLUE SHIELD | Admitting: Family Medicine

## 2017-06-20 VITALS — BP 110/80 | HR 71 | Temp 98.6°F | Ht 65.56 in | Wt 276.7 lb

## 2017-06-20 DIAGNOSIS — Z23 Encounter for immunization: Secondary | ICD-10-CM

## 2017-06-20 DIAGNOSIS — Z0000 Encounter for general adult medical examination without abnormal findings: Secondary | ICD-10-CM | POA: Diagnosis not present

## 2017-06-20 LAB — BASIC METABOLIC PANEL
BUN: 21 mg/dL (ref 6–23)
CO2: 28 meq/L (ref 19–32)
CREATININE: 0.57 mg/dL (ref 0.40–1.20)
Calcium: 9.4 mg/dL (ref 8.4–10.5)
Chloride: 103 mEq/L (ref 96–112)
GFR: 119.62 mL/min (ref >60.00)
Glucose, Bld: 91 mg/dL (ref 70–99)
Potassium: 4.7 mEq/L (ref 3.5–5.1)
SODIUM: 138 meq/L (ref 135–145)

## 2017-06-20 LAB — CBC WITH DIFFERENTIAL/PLATELET
BASOS ABS: 0.1 10*3/uL (ref 0.0–0.1)
Basophils Relative: 0.9 % (ref 0.0–3.0)
EOS PCT: 2.9 % (ref 0.0–5.0)
Eosinophils Absolute: 0.2 10*3/uL (ref 0.0–0.7)
HEMATOCRIT: 42.4 % (ref 36.0–46.0)
Hemoglobin: 14.2 g/dL (ref 12.0–15.0)
LYMPHS PCT: 34.3 % (ref 12.0–46.0)
Lymphs Abs: 2.4 10*3/uL (ref 0.7–4.0)
MCHC: 33.5 g/dL (ref 30.0–36.0)
MCV: 88 fl (ref 78.0–100.0)
MONOS PCT: 10.9 % (ref 3.0–12.0)
Monocytes Absolute: 0.8 10*3/uL (ref 0.1–1.0)
NEUTROS ABS: 3.6 10*3/uL (ref 1.4–7.7)
Neutrophils Relative %: 51 % (ref 43.0–77.0)
PLATELETS: 308 10*3/uL (ref 150.0–400.0)
RBC: 4.81 Mil/uL (ref 3.87–5.11)
RDW: 13.4 % (ref 11.5–15.5)
WBC: 7.1 10*3/uL (ref 4.0–10.5)

## 2017-06-20 LAB — HEPATIC FUNCTION PANEL
ALBUMIN: 4.2 g/dL (ref 3.5–5.2)
ALT: 12 U/L (ref 0–35)
AST: 12 U/L (ref 0–37)
Alkaline Phosphatase: 62 U/L (ref 39–117)
Bilirubin, Direct: 0.1 mg/dL (ref 0.0–0.3)
TOTAL PROTEIN: 7 g/dL (ref 6.0–8.3)
Total Bilirubin: 0.7 mg/dL (ref 0.2–1.2)

## 2017-06-20 LAB — LIPID PANEL
CHOL/HDL RATIO: 5
CHOLESTEROL: 183 mg/dL (ref 0–200)
HDL: 39.9 mg/dL (ref >39.00)
LDL Cholesterol: 114 mg/dL — ABNORMAL HIGH (ref 0–99)
NonHDL: 143.3
TRIGLYCERIDES: 149 mg/dL (ref 0.0–149.0)
VLDL: 29.8 mg/dL (ref 0.0–40.0)

## 2017-06-20 LAB — TSH: TSH: 3.83 u[IU]/mL (ref 0.35–4.50)

## 2017-06-20 NOTE — Progress Notes (Signed)
Subjective:     Patient ID: Abigail [name], female   DOB: 1965/08/28, 52 y.o.   MRN: [id number]  HPI Patient seen for physical exam. She has history of some mild intermittent reflux, obesity, hypertension. She is on spirinolactone partly secondary history of alopecia and blood pressure has been well controlled with that. She is due for tetanus booster. We due for colonoscopy after turning age 21. She sees gynecologist yearly. She's been walking recently 35 minutes to an hour per day  Nonsmoker. No regular alcohol use.  Past Medical History:  Diagnosis Date  . Asthma   . Bursitis   . Hemorrhoids   . Osteoarthritis   . Tinnitus   . UTI (lower urinary tract infection)    Past Surgical History:  Procedure Laterality Date  . CERVICAL POLYPECTOMY    . TOTAL HIP ARTHROPLASTY  10/2008 & 06/2010   bilateral    reports that  has never smoked. she has never used smokeless tobacco. She reports that she does not drink alcohol or use drugs. family history includes Bipolar disorder in her mother; Breast cancer in her unknown relative; Diabetes in her mother; Hypertension in her mother; Lung cancer in her paternal grandfather; Prostate cancer in her father; Stroke in her maternal grandfather. No Known Allergies   Review of Systems  Constitutional: Negative for activity change, appetite change, fatigue, fever and unexpected weight change.  HENT: Negative for ear pain, hearing loss, sore throat and trouble swallowing.   Eyes: Negative for visual disturbance.  Respiratory: Negative for cough and shortness of breath.   Cardiovascular: Negative for chest pain and palpitations.  Gastrointestinal: Negative for abdominal pain, blood in stool, constipation and diarrhea.  Genitourinary: Negative for dysuria and hematuria.  Musculoskeletal: Negative for arthralgias, back pain and myalgias.  Skin: Negative for rash.  Neurological: Negative for dizziness, syncope and headaches.  Hematological: Negative for  adenopathy.  Psychiatric/Behavioral: Negative for confusion and dysphoric mood.       Objective:   Physical Exam  Constitutional: She is oriented to person, place, and time. She appears well-developed and well-nourished.  HENT:  Head: Normocephalic and atraumatic.  Eyes: EOM are normal. Pupils are equal, round, and reactive to light.  Neck: Normal range of motion. Neck supple. No thyromegaly present.  Cardiovascular: Normal rate, regular rhythm and normal heart sounds.  No murmur heard. Pulmonary/Chest: Breath sounds normal. No respiratory distress. She has no wheezes. She has no rales.  Abdominal: Soft. Bowel sounds are normal. She exhibits no distension and no mass. There is no tenderness. There is no rebound and no guarding.  Genitourinary:  Genitourinary Comments: Per GYN  Musculoskeletal: Normal range of motion. She exhibits no edema.  Lymphadenopathy:    She has no cervical adenopathy.  Neurological: She is alert and oriented to person, place, and time. She displays normal reflexes. No cranial nerve deficit.  Skin: No rash noted.  Psychiatric: She has a normal mood and affect. Her behavior is normal. Judgment and thought content normal.       Assessment:     Physical exam. The following issues were addressed    Plan:     -Recommend she try to lose some weight -Tetanus booster given -Obtain screening lab work -Continue regular GYN follow-up -Consider new shingles vaccine at 27 -Recommend yearly flu vaccine. She never got one this year and declines at this time  Eulas Post MD [facility]

## 2017-06-20 NOTE — Addendum Note (Signed)
Addended by: Westley Hummer B on: 06/20/2017 09:50 AM   Modules accepted: Orders

## 2017-06-21 ENCOUNTER — Encounter: Payer: Self-pay | Admitting: Family Medicine

## 2017-08-07 ENCOUNTER — Other Ambulatory Visit: Payer: Self-pay | Admitting: Family Medicine

## 2017-12-04 ENCOUNTER — Encounter: Payer: Self-pay | Admitting: Gastroenterology

## 2017-12-18 ENCOUNTER — Telehealth: Payer: Self-pay | Admitting: Family Medicine

## 2017-12-18 MED ORDER — SPIRONOLACTONE 25 MG PO TABS: 25.0000 mg | ORAL_TABLET | Freq: Every day | ORAL | 3 refills | Status: DC

## 2017-12-18 NOTE — Telephone Encounter (Signed)
Copied from [place] [id number]. Topic: Quick Communication - Rx Refill/Question >> Dec 18, 2017 10:35 AM Abigail [name], NT wrote: Medication: spironolactone (ALDACTONE) 25 MG tablet  Has the patient contacted their pharmacy?no (Agent: If no, request that the patient contact the pharmacy for the refill. (Agent: If yes, when and what did the pharmacy advise?  Preferred Pharmacy (with phone number or street name [facility] [place] [address] [phone] (Phone) [phone] (Fax) Patient would like this prescription sent to the above pharmacy    Agent: Please be advised that RX refills may take up to 3 business days. We ask that you follow-up with your pharmacy.

## 2018-01-06 ENCOUNTER — Telehealth: Payer: Self-pay | Admitting: Family Medicine

## 2018-01-06 MED ORDER — SPIRONOLACTONE 25 MG PO TABS: 25.0000 mg | ORAL_TABLET | Freq: Every day | ORAL | 1 refills | Status: DC

## 2018-01-06 MED ORDER — OMEPRAZOLE 20 MG PO CPDR: 20.0000 mg | DELAYED_RELEASE_CAPSULE | Freq: Every day | ORAL | 1 refills | Status: DC

## 2018-01-06 NOTE — Telephone Encounter (Signed)
Prilosec refill; LRF 06/16/17; # 90; RF x 1 Aldactone refill; L[facility] 12/18/17; # 90, RF x 3 Last OV: 06/20/17 PCP: Dr. Elease Hashimoto Pharmacy: [facility]  Refills done per protocol.  [facility] per [facility] request, to cancel the current Rx for Spironolactone, as it has been transferred to pt's mail order pharmacy.

## 2018-01-06 NOTE — Telephone Encounter (Signed)
Copied from [place] [id number]. Topic: Quick Communication - Rx Refill/Question >> Jan 06, 2018  2:56 PM Scherrie Gerlach wrote: Medication: omeprazole (PRILOSEC) 20 MG capsule spironolactone (ALDACTONE) 25 MG tablet ([facility]/next refill sent to new pharmacy/cancel all other refills.) 90 days  Pt has changed mail orders and needs new Rx sent to [facility], [place] [phone] (Phone) [phone] (Fax)

## 2018-02-18 ENCOUNTER — Ambulatory Visit: Payer: BLUE CROSS/BLUE SHIELD | Admitting: Family Medicine

## 2018-02-18 ENCOUNTER — Ambulatory Visit: Payer: Self-pay

## 2018-02-18 NOTE — Telephone Encounter (Addendum)
Pt. Reports she started having right knee pain 1 month ago. Pain has become progressively worse. While she was walking her dog today, pain was worse. 7-8 on a 1-10 pain scale. No swelling noted. States she has a history of arthritis. Appointment made for tomorrow. No availability with Dr. Elease Hashimoto. Pt. Will try Advil and icing her knee. Reason for Disposition . [1] Very swollen joint AND [2] no fever  Answer Assessment - Initial Assessment Questions 1. LOCATION and RADIATION: "Where is the pain located?"      Right knee outer aspect 2. QUALITY: "What does the pain feel like?"  (e.g., sharp, dull, aching, burning)     Sharp 3. SEVERITY: "How bad is the pain?" "What does it keep you from doing?"   (Scale 1-10; or mild, moderate, severe)   -  MILD (1-3): doesn't interfere with normal activities    -  MODERATE (4-7): interferes with normal activities (e.g., work or school) or awakens from sleep, limping    -  SEVERE (8-10): excruciating pain, unable to do any normal activities, unable to walk     7-8 4. ONSET: "When did the pain start?" "Does it come and go, or is it there all the time?"     Started 2 weeks ago 5. RECURRENT: "Have you had this pain before?" If so, ask: "When, and what happened then?"     Yes - has arthritis 6. SETTING: "Has there been any recent work, exercise or other activity that involved that part of the body?"      No injury 7. AGGRAVATING FACTORS: "What makes the knee pain worse?" (e.g., walking, climbing stairs, running)     Walking 8. ASSOCIATED SYMPTOMS: "Is there any swelling or redness of the knee?"     No 9. OTHER SYMPTOMS: "Do you have any other symptoms?" (e.g., chest pain, difficulty breathing, fever, calf pain)     No 10. PREGNANCY: "Is there any chance you are pregnant?" "When was your last menstrual period?"       No  Protocols used: KNEE PAIN-A-AH

## 2018-02-18 NOTE — Telephone Encounter (Signed)
Will send to Dr Elberta Fortis as Juluis Rainier

## 2018-02-19 ENCOUNTER — Encounter: Payer: Self-pay | Admitting: Adult Health

## 2018-02-19 ENCOUNTER — Ambulatory Visit (INDEPENDENT_AMBULATORY_CARE_PROVIDER_SITE_OTHER): Payer: BLUE CROSS/BLUE SHIELD

## 2018-02-19 ENCOUNTER — Ambulatory Visit: Payer: BLUE CROSS/BLUE SHIELD | Admitting: Adult Health

## 2018-02-19 VITALS — BP 136/96 | Temp 98.3°F | Wt 276.0 lb

## 2018-02-19 DIAGNOSIS — M25561 Pain in right knee: Secondary | ICD-10-CM

## 2018-02-19 MED ORDER — MELOXICAM 7.5 MG PO TABS: 7.5000 mg | ORAL_TABLET | Freq: Every day | ORAL | 0 refills | Status: DC

## 2018-02-19 NOTE — Progress Notes (Signed)
Subjective:    Patient ID: Abigail [name], female    DOB: Sep 06, 1965, 52 y.o.   MRN: [id number]  HPI 52 year year old female who  has a past medical history of Asthma, Bursitis, Hemorrhoids, Osteoarthritis, Tinnitus, and UTI (lower urinary tract infection).  She is a patient of Dr. Elease Hashimoto, who I am seeing today for an acute issue of right knee pain. Her pain has been present for 4-5 weeks. Pain started after walking down a steep incline. As the weeks have progressed pain has been waxing and weaning. Yesterday she was walking down a slight incline and when she hit flat pavement she felt a" popping" sensation along the outside of her right kneeand had slightly worsening pain. When she got home her knee was stiff. In the meantime, she got a knee brace from the pharmacy and her sister gave her two Mobic. This morning she feels improved.   She denies any warmness, edema, or bruising    Review of Systems See HPI   Past Medical History:  Diagnosis Date  . Asthma   . Bursitis   . Hemorrhoids   . Osteoarthritis   . Tinnitus   . UTI (lower urinary tract infection)     Social History   Socioeconomic History  . Marital status: Married    Spouse name: Not on file  . Number of children: 0  . Years of education: Not on file  . Highest education level: Not on file  Occupational History  . Occupation: Lexicographer: [employer]  . Financial resource strain: Not on file  . Food insecurity:    Worry: Not on file    Inability: Not on file  . Transportation needs:    Medical: Not on file    Non-medical: Not on file  Tobacco Use  . Smoking status: Never Smoker  . Smokeless tobacco: Never Used  Substance and Sexual Activity  . Alcohol use: No  . Drug use: No  . Sexual activity: Not on file  Lifestyle  . Physical activity:    Days per week: Not on file    Minutes per session: Not on file  . Stress: Not on file  Relationships  . Social connections:      Talks on phone: Not on file    Gets together: Not on file    Attends religious service: Not on file    Active member of club or organization: Not on file    Attends meetings of clubs or organizations: Not on file    Relationship status: Not on file  . Intimate partner violence:    Fear of current or ex partner: Not on file    Emotionally abused: Not on file    Physically abused: Not on file    Forced sexual activity: Not on file  Other Topics Concern  . Not on file  Social History Narrative  . Not on file    Past Surgical History:  Procedure Laterality Date  . CERVICAL POLYPECTOMY    . TOTAL HIP ARTHROPLASTY  10/2008 & 06/2010   bilateral    Family History  Problem Relation Age of Onset  . Hypertension Mother   . Diabetes Mother   . Bipolar disorder Mother   . Breast cancer Unknown        maternal great grandmother  . Lung cancer Paternal Grandfather   . Prostate cancer Father   . Stroke Maternal Grandfather   . Colon  cancer Neg Hx     No Known Allergies  Current Outpatient Medications on File Prior to Visit  Medication Sig Dispense Refill  . Biotin (BIOTIN 5000) 5 MG CAPS Take 1 capsule by mouth daily.    . Glucosamine HCl 1000 MG TABS Take by mouth.    Marland Kitchen ibuprofen (ADVIL) 200 MG tablet Take 200 mg by mouth as needed.    Marland Kitchen ketoconazole (NIZORAL) 2 % shampoo     . Lysine 500 MG CAPS Take 1 capsule by mouth daily.    Marland Kitchen omeprazole (PRILOSEC) 20 MG capsule Take 1 capsule (20 mg total) by mouth daily. 90 capsule 1  . spironolactone (ALDACTONE) 25 MG tablet Take 1 tablet (25 mg total) by mouth daily. 90 tablet 1   No current facility-administered medications on file prior to visit.     BP (!) 136/96   Temp 98.3 F (36.8 C) (Oral)   Wt 276 lb (125.2 kg)   BMI 45.15 kg/m       Objective:   Physical Exam  Constitutional: She appears well-developed and well-nourished.  Cardiovascular: Normal rate, regular rhythm, normal heart sounds and intact distal pulses.   Pulmonary/Chest: Effort normal and breath sounds normal.  Musculoskeletal: She exhibits no edema, tenderness or deformity.       Right knee: She exhibits normal range of motion, no swelling, no erythema, normal alignment, no LCL laxity, normal patellar mobility, no bony tenderness and normal meniscus. No tenderness found. No medial joint line, no lateral joint line, no MCL, no LCL and no patellar tendon tenderness noted.  No pain with straight leg raise, knee to chest, internal/external rotation  Negative anterior and posterior drawer test    Neurological: She is alert.  Skin: She is not diaphoretic.  Nursing note and vitals reviewed.     Assessment & Plan:  1. Acute pain of right knee - Does not appear to be meniscal injury or MCL/ACL tear. Possibly arthritis with ligament strain.  - Will get xray at this time. Do not see a need for MRI at this time - DG Knee 1-2 Views Right; Future - meloxicam (MOBIC) 7.5 MG tablet; Take 1 tablet (7.5 mg total) by mouth daily.  Dispense: 30 tablet; Refill: 0   Dorothyann Peng, NP

## 2018-02-20 NOTE — Telephone Encounter (Signed)
FYI, patient saw [facility] yesterday.

## 2018-03-05 ENCOUNTER — Telehealth: Payer: Self-pay | Admitting: Family Medicine

## 2018-03-05 NOTE — Telephone Encounter (Signed)
Copied from [place] [id number]. Topic: Quick Communication - See Telephone Encounter >> Mar 05, 2018  2:59 PM Blase Mess A wrote: CRM for notification. See Telephone encounter for: 03/05/18.  Patient is calling regarding right knee pain.  Patient is requesting evaluation for PT on right knee on 03/23/18. She has an evaluation for 03/23/18 at 4:00pm for Left ankle. At [facility]. Please advise [phone]  Patient want Tommi Rumps to know that  meloxicam (MOBIC) 7.5 MG tablet [[id number]] was not working. Or does she want her to take 15mg  or ibuprofen 800mg  every 6hours (patient has been able to walk on this)  ? Please advise

## 2018-03-06 ENCOUNTER — Other Ambulatory Visit: Payer: Self-pay | Admitting: Adult Health

## 2018-03-06 DIAGNOSIS — M25561 Pain in right knee: Secondary | ICD-10-CM

## 2018-03-06 NOTE — Telephone Encounter (Signed)
She has been placed for PT  For right knee pain   She can take Motrin 800 mg PRN   If no improvement then please follow up with PCP

## 2018-03-09 NOTE — Telephone Encounter (Signed)
Left message on machine for patient to return our call CRM 

## 2018-03-13 ENCOUNTER — Other Ambulatory Visit: Payer: Self-pay | Admitting: Adult Health

## 2018-03-13 DIAGNOSIS — M25561 Pain in right knee: Secondary | ICD-10-CM

## 2018-03-13 NOTE — Telephone Encounter (Signed)
Refill once.  Would not recommend chronic use.

## 2018-03-13 NOTE — Telephone Encounter (Signed)
Last OV 02/19/18 with Tommi Rumps, No future OV  Last filled 02/19/18, # 30 with 0 refills  Okay to continue?

## 2018-03-17 NOTE — Telephone Encounter (Signed)
2nd attempt to call patient. Left message on machine for patient to return our call. CRM

## 2018-03-18 ENCOUNTER — Encounter: Payer: Self-pay | Admitting: Family Medicine

## 2018-03-18 NOTE — Telephone Encounter (Signed)
Please see message.  Please advise. 

## 2018-03-18 NOTE — Telephone Encounter (Signed)
Let's refill Meloxicam 15 mg one po qd prn #30 and set up follow up if knee pain no better.  Do take concomitant with Motrin.

## 2018-03-18 NOTE — Telephone Encounter (Signed)
Left message on machine for patient to return our call. 3rd attempt.

## 2018-03-18 NOTE — Telephone Encounter (Signed)
Patient returned call. I advised of the note by Dr. Elease Hashimoto below and that he was the one who ordered the Meloxicam 7.5 mg. She says that the medication was not working and was wondering at one time if she could increase it to 15 mg. She says she just decided to stop the Meloxicam and started back on Motrin 800 mg TID and she says she can at least walk. I advised I will send to Dr. Elease Hashimoto for review.

## 2018-03-19 NOTE — Telephone Encounter (Signed)
This encounter was created in error - please disregard.

## 2018-03-20 MED ORDER — MELOXICAM 15 MG PO TABS: 15.0000 mg | ORAL_TABLET | Freq: Every day | ORAL | 0 refills | Status: DC

## 2018-03-20 NOTE — Addendum Note (Signed)
Addended by: Agnes Lawrence on: 03/20/2018 11:46 AM   Modules accepted: Orders

## 2018-03-20 NOTE — Telephone Encounter (Signed)
Pt aware a 15 mg Meloxicam will be refilled And to follow up if knee pain not better. Please send to   [facility] [place], [place] [phone] (Phone) [phone] (Fax)

## 2018-03-20 NOTE — Telephone Encounter (Signed)
I left a message for the pt to return my call.  CRM also created. 

## 2018-03-20 NOTE — Telephone Encounter (Signed)
Rx done. 

## 2018-03-24 ENCOUNTER — Ambulatory Visit: Payer: BLUE CROSS/BLUE SHIELD | Attending: Adult Health | Admitting: Physical Therapy

## 2018-03-24 ENCOUNTER — Encounter: Payer: Self-pay | Admitting: Physical Therapy

## 2018-04-21 ENCOUNTER — Other Ambulatory Visit: Payer: Self-pay | Admitting: Family Medicine

## 2018-04-21 NOTE — Telephone Encounter (Signed)
Last OV 02/19/18 with Tommi Rumps for Right Knee Pain  I only see order for Mobic 7.5 mg, I do not see approval for 15 mg  Please advise as patient never called [place] back from note 03/05/18

## 2018-04-23 NOTE — Telephone Encounter (Signed)
Refill Mobic 15 mg po qd #30 with one refill.

## 2018-05-04 DIAGNOSIS — Z96643 Presence of artificial hip joint, bilateral: Secondary | ICD-10-CM | POA: Insufficient documentation

## 2018-06-23 ENCOUNTER — Encounter: Payer: BLUE CROSS/BLUE SHIELD | Admitting: Family Medicine

## 2018-06-26 ENCOUNTER — Other Ambulatory Visit: Payer: Self-pay | Admitting: Family Medicine

## 2018-07-01 ENCOUNTER — Other Ambulatory Visit: Payer: Self-pay

## 2018-07-01 ENCOUNTER — Encounter: Payer: Self-pay | Admitting: Family Medicine

## 2018-07-01 ENCOUNTER — Ambulatory Visit (INDEPENDENT_AMBULATORY_CARE_PROVIDER_SITE_OTHER): Payer: BLUE CROSS/BLUE SHIELD | Admitting: Family Medicine

## 2018-07-01 VITALS — BP 130/82 | HR 74 | Temp 98.3°F | Ht 65.5 in | Wt 277.8 lb

## 2018-07-01 DIAGNOSIS — Z0000 Encounter for general adult medical examination without abnormal findings: Secondary | ICD-10-CM

## 2018-07-01 LAB — HEPATIC FUNCTION PANEL
ALBUMIN: 4.4 g/dL (ref 3.5–5.2)
ALT: 13 U/L (ref 0–35)
AST: 11 U/L (ref 0–37)
Alkaline Phosphatase: 58 U/L (ref 39–117)
Bilirubin, Direct: 0.1 mg/dL (ref 0.0–0.3)
TOTAL PROTEIN: 7 g/dL (ref 6.0–8.3)
Total Bilirubin: 0.6 mg/dL (ref 0.2–1.2)

## 2018-07-01 LAB — CBC WITH DIFFERENTIAL/PLATELET
Basophils Absolute: 0.1 10*3/uL (ref 0.0–0.1)
Basophils Relative: 1 % (ref 0.0–3.0)
EOS PCT: 4.6 % (ref 0.0–5.0)
Eosinophils Absolute: 0.3 10*3/uL (ref 0.0–0.7)
HCT: 42.8 % (ref 36.0–46.0)
Hemoglobin: 14.2 g/dL (ref 12.0–15.0)
LYMPHS ABS: 2.2 10*3/uL (ref 0.7–4.0)
Lymphocytes Relative: 34.9 % (ref 12.0–46.0)
MCHC: 33.2 g/dL (ref 30.0–36.0)
MCV: 89.6 fl (ref 78.0–100.0)
MONOS PCT: 10.3 % (ref 3.0–12.0)
Monocytes Absolute: 0.7 10*3/uL (ref 0.1–1.0)
NEUTROS ABS: 3.2 10*3/uL (ref 1.4–7.7)
NEUTROS PCT: 49.2 % (ref 43.0–77.0)
PLATELETS: 325 10*3/uL (ref 150.0–400.0)
RBC: 4.78 Mil/uL (ref 3.87–5.11)
RDW: 13.6 % (ref 11.5–15.5)
WBC: 6.4 10*3/uL (ref 4.0–10.5)

## 2018-07-01 LAB — BASIC METABOLIC PANEL
BUN: 26 mg/dL — ABNORMAL HIGH (ref 6–23)
CALCIUM: 9.4 mg/dL (ref 8.4–10.5)
CO2: 28 meq/L (ref 19–32)
Chloride: 105 mEq/L (ref 96–112)
Creatinine, Ser: 0.61 mg/dL (ref 0.40–1.20)
GFR: 103.64 mL/min (ref >60.00)
Glucose, Bld: 100 mg/dL — ABNORMAL HIGH (ref 70–99)
POTASSIUM: 4.9 meq/L (ref 3.5–5.1)
SODIUM: 139 meq/L (ref 135–145)

## 2018-07-01 LAB — LIPID PANEL
Cholesterol: 186 mg/dL (ref 0–200)
HDL: 43.9 mg/dL (ref >39.00)
LDL Cholesterol: 116 mg/dL — ABNORMAL HIGH (ref 0–99)
NonHDL: 142.41
TRIGLYCERIDES: 130 mg/dL (ref 0.0–149.0)
Total CHOL/HDL Ratio: 4
VLDL: 26 mg/dL (ref 0.0–40.0)

## 2018-07-01 LAB — TSH: TSH: 2.68 u[IU]/mL (ref 0.35–4.50)

## 2018-07-01 MED ORDER — KETOCONAZOLE 2 % EX SHAM: MEDICATED_SHAMPOO | CUTANEOUS | 2 refills | Status: DC

## 2018-07-01 NOTE — Progress Notes (Signed)
Subjective:     Patient ID: Abigail [name], female   DOB: 1965/09/12, 53 y.o.   MRN: [id number]  HPI Patient seen for physical exam.  Chronic problems include morbid obesity, GERD, alopecia, hypertension.  She just turned 52.  Has never had screening colonoscopy.  No history of shingles vaccine.  She sees gynecologist yearly and has had recent mammogram and Pap smear through them.  She is requesting lab work.  Mother had type 2 diabetes and hypertension.  She declined flu vaccine.  Tetanus is up-to-date.  Past Medical History:  Diagnosis Date  . Asthma   . Bursitis   . Hemorrhoids   . Osteoarthritis   . Tinnitus   . UTI (lower urinary tract infection)    Past Surgical History:  Procedure Laterality Date  . CERVICAL POLYPECTOMY    . TOTAL HIP ARTHROPLASTY  10/2008 & 06/2010   bilateral    reports that she has never smoked. She has never used smokeless tobacco. She reports that she does not drink alcohol or use drugs. family history includes Bipolar disorder in her mother; Breast cancer in an other family member; Diabetes in her mother; Hypertension in her mother; Lung cancer in her paternal grandfather; Prostate cancer in her father; Stroke in her maternal grandfather. No Known Allergies   Review of Systems  Constitutional: Negative for activity change, appetite change, fatigue, fever and unexpected weight change.  HENT: Negative for ear pain, hearing loss, sore throat and trouble swallowing.   Eyes: Negative for visual disturbance.  Respiratory: Negative for cough and shortness of breath.   Cardiovascular: Negative for chest pain and palpitations.  Gastrointestinal: Negative for abdominal pain, blood in stool, constipation and diarrhea.  Genitourinary: Negative for dysuria and hematuria.  Musculoskeletal: Positive for arthralgias and back pain. Negative for myalgias.  Skin: Negative for rash.  Neurological: Negative for dizziness, syncope and headaches.  Hematological: Negative  for adenopathy.  Psychiatric/Behavioral: Negative for confusion and dysphoric mood.       Objective:   Physical Exam Constitutional:      Appearance: She is well-developed.  HENT:     Head: Normocephalic and atraumatic.  Eyes:     Pupils: Pupils are equal, round, and reactive to light.  Neck:     Musculoskeletal: Normal range of motion and neck supple.     Thyroid: No thyromegaly.  Cardiovascular:     Rate and Rhythm: Normal rate and regular rhythm.     Heart sounds: Normal heart sounds. No murmur.  Pulmonary:     Effort: No respiratory distress.     Breath sounds: Normal breath sounds. No wheezing or rales.  Abdominal:     General: Bowel sounds are normal. There is no distension.     Palpations: Abdomen is soft. There is no mass.     Tenderness: There is no abdominal tenderness. There is no guarding or rebound.  Musculoskeletal: Normal range of motion.  Lymphadenopathy:     Cervical: No cervical adenopathy.  Skin:    Findings: No rash.  Neurological:     Mental Status: She is alert and oriented to person, place, and time.     Cranial Nerves: No cranial nerve deficit.     Deep Tendon Reflexes: Reflexes normal.  Psychiatric:        Behavior: Behavior normal.        Thought Content: Thought content normal.        Judgment: Judgment normal.        Assessment:  Physical exam.  The following health maintenance issues were discussed    Plan:     -She is encouraged to lose some weight -Set up screening colonoscopy -Discussed shingles vaccine and she will check on insurance coverage -Obtain screening lab work -Discussed flu vaccine and she declines.  She is encouraged to get this next fall  Eulas Post MD [facility]

## 2018-07-01 NOTE — Patient Instructions (Signed)
Preventive Care 40-64 Years, Female Preventive care refers to lifestyle choices and visits with your health care provider that can promote health and wellness. What does preventive care include?   A yearly physical exam. This is also called an annual well check.  Dental exams once or twice a year.  Routine eye exams. Ask your health care provider how often you should have your eyes checked.  Personal lifestyle choices, including: ? Daily care of your teeth and gums. ? Regular physical activity. ? Eating a healthy diet. ? Avoiding tobacco and drug use. ? Limiting alcohol use. ? Practicing safe sex. ? Taking low-dose aspirin daily starting at age 50. ? Taking vitamin and mineral supplements as recommended by your health care provider. What happens during an annual well check? The services and screenings done by your health care provider during your annual well check will depend on your age, overall health, lifestyle risk factors, and family history of disease. Counseling Your health care provider may ask you questions about your:  Alcohol use.  Tobacco use.  Drug use.  Emotional well-being.  Home and relationship well-being.  Sexual activity.  Eating habits.  Work and work environment.  Method of birth control.  Menstrual cycle.  Pregnancy history. Screening You may have the following tests or measurements:  Height, weight, and BMI.  Blood pressure.  Lipid and cholesterol levels. These may be checked every 5 years, or more frequently if you are over 50 years old.  Skin check.  Lung cancer screening. You may have this screening every year starting at age 55 if you have a 30-pack-year history of smoking and currently smoke or have quit within the past 15 years.  Colorectal cancer screening. All adults should have this screening starting at age 50 and continuing until age 75. Your health care provider may recommend screening at age 45. You will have tests every  1-10 years, depending on your results and the type of screening test. People at increased risk should start screening at an earlier age. Screening tests may include: ? Guaiac-based fecal occult blood testing. ? Fecal immunochemical test (FIT). ? Stool DNA test. ? Virtual colonoscopy. ? Sigmoidoscopy. During this test, a flexible tube with a tiny camera (sigmoidoscope) is used to examine your rectum and lower colon. The sigmoidoscope is inserted through your anus into your rectum and lower colon. ? Colonoscopy. During this test, a long, thin, flexible tube with a tiny camera (colonoscope) is used to examine your entire colon and rectum.  Hepatitis C blood test.  Hepatitis B blood test.  Sexually transmitted disease (STD) testing.  Diabetes screening. This is done by checking your blood sugar (glucose) after you have not eaten for a while (fasting). You may have this done every 1-3 years.  Mammogram. This may be done every 1-2 years. Talk to your health care provider about when you should start having regular mammograms. This may depend on whether you have a family history of breast cancer.  BRCA-related cancer screening. This may be done if you have a family history of breast, ovarian, tubal, or peritoneal cancers.  Pelvic exam and Pap test. This may be done every 3 years starting at age 21. Starting at age 30, this may be done every 5 years if you have a Pap test in combination with an HPV test.  Bone density scan. This is done to screen for osteoporosis. You may have this scan if you are at high risk for osteoporosis. Discuss your test results, treatment options,   and if necessary, the need for more tests with your health care provider. Vaccines Your health care provider may recommend certain vaccines, such as:  Influenza vaccine. This is recommended every year.  Tetanus, diphtheria, and acellular pertussis (Tdap, Td) vaccine. You may need a Td booster every 10 years.  Varicella  vaccine. You may need this if you have not been vaccinated.  Zoster vaccine. You may need this after age 55.  Measles, mumps, and rubella (MMR) vaccine. You may need at least one dose of MMR if you were born in 1957 or later. You may also need a second dose.  Pneumococcal 13-valent conjugate (PCV13) vaccine. You may need this if you have certain conditions and were not previously vaccinated.  Pneumococcal polysaccharide (PPSV23) vaccine. You may need one or two doses if you smoke cigarettes or if you have certain conditions.  Meningococcal vaccine. You may need this if you have certain conditions.  Hepatitis A vaccine. You may need this if you have certain conditions or if you travel or work in places where you may be exposed to hepatitis A.  Hepatitis B vaccine. You may need this if you have certain conditions or if you travel or work in places where you may be exposed to hepatitis B.  Haemophilus influenzae type b (Hib) vaccine. You may need this if you have certain conditions. Talk to your health care provider about which screenings and vaccines you need and how often you need them. This information is not intended to replace advice given to you by your health care provider. Make sure you discuss any questions you have with your health care provider. Document Released: 05/12/2015 Document Revised: 06/05/2017 Document Reviewed: 02/14/2015 Elsevier Interactive Patient Education  2019 [employer].  Consider Shingrix vaccine and check on insurance coverage.  We will set up colonoscopy

## 2018-07-03 ENCOUNTER — Telehealth: Payer: Self-pay

## 2018-07-03 NOTE — Telephone Encounter (Signed)
[facility] form faxed today

## 2018-09-23 ENCOUNTER — Other Ambulatory Visit: Payer: Self-pay

## 2018-09-23 ENCOUNTER — Telehealth: Payer: Self-pay | Admitting: Family Medicine

## 2018-09-23 MED ORDER — OMEPRAZOLE 20 MG PO CPDR: 20.0000 mg | DELAYED_RELEASE_CAPSULE | Freq: Every day | ORAL | 1 refills | Status: DC

## 2018-09-23 NOTE — Telephone Encounter (Signed)
Copied from [place] [id number]. Topic: Quick Communication - Rx Refill/Question >> Sep 23, 2018  9:49 AM Burchel, Abbi R wrote: Medication: omeprazole (PRILOSEC) 20 MG capsule  Preferred Pharmacy:  [place], [place] [phone] (Phone) [phone] (Fax)    Pt was advised that RX refills may take up to 3 business days. We ask that you follow-up with your pharmacy.

## 2018-09-23 NOTE — Telephone Encounter (Signed)
Medication sent to pharmacy for patient.  

## 2018-11-02 ENCOUNTER — Encounter: Payer: Self-pay | Admitting: Gastroenterology

## 2018-11-02 ENCOUNTER — Other Ambulatory Visit: Payer: Self-pay

## 2018-11-10 ENCOUNTER — Other Ambulatory Visit: Payer: Self-pay | Admitting: Family Medicine

## 2018-11-16 ENCOUNTER — Encounter: Payer: BLUE CROSS/BLUE SHIELD | Admitting: Gastroenterology

## 2018-12-28 ENCOUNTER — Other Ambulatory Visit: Payer: Self-pay

## 2018-12-28 ENCOUNTER — Ambulatory Visit (AMBULATORY_SURGERY_CENTER): Payer: Self-pay | Admitting: *Deleted

## 2018-12-28 VITALS — Temp 97.6°F | Ht 66.5 in | Wt 282.4 lb

## 2018-12-28 DIAGNOSIS — Z1211 Encounter for screening for malignant neoplasm of colon: Secondary | ICD-10-CM

## 2018-12-28 MED ORDER — NA SULFATE-K SULFATE-MG SULF 17.5-3.13-1.6 GM/177ML PO SOLN: 1.0000 | Freq: Once | ORAL | 0 refills | Status: AC

## 2018-12-28 NOTE — Progress Notes (Signed)
No egg or soy allergy known to patient  No issues with past sedation with any surgeries  or procedures, no intubation problems  No diet pills per patient No home 02 use per patient  No blood thinners per patient  Pt denies issues with constipation  No A fib or A flutter  EMMI video sent to pt's e mail   supprep coupon provided

## 2019-01-08 ENCOUNTER — Encounter: Payer: Self-pay | Admitting: Gastroenterology

## 2019-01-09 ENCOUNTER — Encounter: Payer: BLUE CROSS/BLUE SHIELD | Admitting: Gastroenterology

## 2019-01-15 ENCOUNTER — Telehealth: Payer: Self-pay | Admitting: Gastroenterology

## 2019-01-15 NOTE — Telephone Encounter (Signed)
Pt returned call and answered “No” to all questions.  °  °Pt made aware of that care partner may come to the lobby during the procedure but will need to provide their own mask. ° ° °

## 2019-01-15 NOTE — Telephone Encounter (Signed)

## 2019-01-18 ENCOUNTER — Other Ambulatory Visit: Payer: Self-pay

## 2019-01-18 ENCOUNTER — Other Ambulatory Visit: Payer: Self-pay | Admitting: Gastroenterology

## 2019-01-18 ENCOUNTER — Ambulatory Visit (AMBULATORY_SURGERY_CENTER): Payer: BLUE CROSS/BLUE SHIELD | Admitting: Gastroenterology

## 2019-01-18 ENCOUNTER — Encounter: Payer: Self-pay | Admitting: Gastroenterology

## 2019-01-18 VITALS — BP 105/70 | HR 83 | Temp 99.2°F | Resp 13 | Ht 66.5 in | Wt 282.0 lb

## 2019-01-18 DIAGNOSIS — Z1211 Encounter for screening for malignant neoplasm of colon: Secondary | ICD-10-CM

## 2019-01-18 DIAGNOSIS — D120 Benign neoplasm of cecum: Secondary | ICD-10-CM | POA: Diagnosis not present

## 2019-01-18 DIAGNOSIS — D123 Benign neoplasm of transverse colon: Secondary | ICD-10-CM | POA: Diagnosis not present

## 2019-01-18 MED ORDER — SODIUM CHLORIDE 0.9 % IV SOLN: 500.0000 mL | Freq: Once | INTRAVENOUS | Status: DC

## 2019-01-18 NOTE — Progress Notes (Signed)
Pt's states no medical or surgical changes since previsit or office visit. 

## 2019-01-18 NOTE — Progress Notes (Signed)
Temperature taken by K.A., VS taken by C.W. 

## 2019-01-18 NOTE — Progress Notes (Signed)
PT taken to PACU. Monitors in place. VSS. Report given to RN. 

## 2019-01-18 NOTE — Op Note (Signed)
[facility] Patient Name: Abigail [name] Procedure Date: 01/18/2019 8:07 AM MRN: [id number] Endoscopist: Ladene Artist , MD Age: 53 Referring MD:  Date of Birth: June 05, 1965 Gender: Female Account #: [id number] Procedure:                Colonoscopy Indications:              Screening for colorectal malignant neoplasm Medicines:                Monitored Anesthesia Care Procedure:                Pre-Anesthesia Assessment:                           - Prior to the procedure, a History and Physical                            was performed, and patient medications and                            allergies were reviewed. The patient's tolerance of                            previous anesthesia was also reviewed. The risks                            and benefits of the procedure and the sedation                            options and risks were discussed with the patient.                            All questions were answered, and informed consent                            was obtained. Prior Anticoagulants: The patient has                            taken no previous anticoagulant or antiplatelet                            agents. ASA Grade Assessment: III - A patient with                            severe systemic disease. After reviewing the risks                            and benefits, the patient was deemed in                            satisfactory condition to undergo the procedure.                           After obtaining informed consent, the colonoscope  was passed under direct vision. Throughout the                            procedure, the patient's blood pressure, pulse, and                            oxygen saturations were monitored continuously. The                            Colonoscope was introduced through the anus and                            advanced to the the cecum, identified by                            appendiceal orifice and  ileocecal valve. The                            ileocecal valve, appendiceal orifice, and rectum                            were photographed. The quality of the bowel                            preparation was good. The colonoscopy was performed                            without difficulty. The patient tolerated the                            procedure well. Scope In: 8:16:49 AM Scope Out: 8:35:48 AM Scope Withdrawal Time: 0 hours 17 minutes 18 seconds  Total Procedure Duration: 0 hours 18 minutes 59 seconds  Findings:                 The perianal and digital rectal examinations were                            normal.                           A 4 mm polyp was found in the transverse colon. The                            polyp was sessile. The polyp was removed with a                            cold biopsy forceps. Resection and retrieval were                            complete.                           Five sessile polyps were found in the transverse  colon (4) and cecum (1). The polyps were 7 to 8 mm                            in size. These polyps were removed with a cold                            snare. Resection and retrieval were complete.                           Multiple medium-mouthed diverticula were found in                            the left colon. There was no evidence of                            diverticular bleeding.                           The exam was otherwise without abnormality on                            direct and retroflexion views. Complications:            No immediate complications. Estimated blood loss:                            None. Estimated Blood Loss:     Estimated blood loss: none. Impression:               - One 4 mm polyp in the transverse colon, removed                            with a cold biopsy forceps. Resected and retrieved.                           - Five 7 to 8 mm polyps in the transverse colon and                             in the cecum, removed with a cold snare. Resected                            and retrieved.                           - Mild diverticulosis in the left colon. There was                            no evidence of diverticular bleeding.                           - The examination was otherwise normal on direct                            and retroflexion views. Recommendation:           -  Repeat colonoscopy date to be determined after                            pending pathology results are reviewed for                            surveillance.                           - Patient has a contact number available for                            emergencies. The signs and symptoms of potential                            delayed complications were discussed with the                            patient. Return to normal activities tomorrow.                            Written discharge instructions were provided to the                            patient.                           - Resume previous diet.                           - Continue present medications.                           - Await pathology results. Ladene Artist, MD 01/18/2019 8:40:15 AM This report has been signed electronically.

## 2019-01-18 NOTE — Progress Notes (Signed)
Called to room to assist during endoscopic procedure.  Patient ID and intended procedure confirmed with present staff. Received instructions for my participation in the procedure from the performing physician.  

## 2019-01-20 ENCOUNTER — Telehealth: Payer: Self-pay

## 2019-01-20 NOTE — Telephone Encounter (Signed)
Attempted to reach patient for post-procedure f/u call. No answer. Left message for her to please not hesitate to call us if she has questions/concerns regarding her care or if she develops any SXS of COVID19 in the near future.

## 2019-01-20 NOTE — Telephone Encounter (Signed)
  Follow up Call-  Call back number 01/18/2019  Post procedure Call Back phone  # [phone]  Permission to leave phone message Yes  Some recent data might be hidden     Left message

## 2019-01-21 ENCOUNTER — Telehealth: Payer: Self-pay | Admitting: Family Medicine

## 2019-01-21 ENCOUNTER — Encounter: Payer: Self-pay | Admitting: Gastroenterology

## 2019-01-21 NOTE — Telephone Encounter (Signed)
Copied from [place] [id number]. Topic: General - Other >> Jan 21, 2019 12:47 PM Keene Breath wrote: Reason for CRM: Patient called to ask the nurse to call her regarding a wart removal.  She was not sure if the doctor would do it or if she needed a referral to a dermatologist.  Please call to discuss with the patient.  CB# [phone] or Cell # [phone]

## 2019-01-22 NOTE — Telephone Encounter (Signed)
OK to schedule or would you rather send referrral?

## 2019-01-22 NOTE — Telephone Encounter (Signed)
We can treat with liquid nitrogen.  Offer appt.

## 2019-01-29 NOTE — Telephone Encounter (Signed)
Called patient. No answer. LVM for patient to call back to office to make an appointment

## 2019-05-03 ENCOUNTER — Encounter: Payer: Self-pay | Admitting: Family Medicine

## 2019-05-03 DIAGNOSIS — Z789 Other specified health status: Secondary | ICD-10-CM

## 2019-05-03 NOTE — Telephone Encounter (Signed)
I think she is referring to the antibody and not the antigen test.  Antigen test is for acute Covid symptoms and antibody test would help clarify if she had Covid back in Nov.  I put in order for the antibody test is she wants to set up.

## 2019-05-07 ENCOUNTER — Other Ambulatory Visit: Payer: Self-pay | Admitting: Family Medicine

## 2019-05-07 NOTE — Telephone Encounter (Signed)
Last OV 07/01/18 Last refill 11/10/18 #90/1 Next OV not scheduled

## 2019-05-21 ENCOUNTER — Other Ambulatory Visit (INDEPENDENT_AMBULATORY_CARE_PROVIDER_SITE_OTHER): Payer: Managed Care, Other (non HMO)

## 2019-05-21 ENCOUNTER — Other Ambulatory Visit: Payer: Self-pay

## 2019-05-21 DIAGNOSIS — Z0184 Encounter for antibody response examination: Secondary | ICD-10-CM

## 2019-05-21 DIAGNOSIS — Z789 Other specified health status: Secondary | ICD-10-CM

## 2019-05-22 ENCOUNTER — Encounter: Payer: Self-pay | Admitting: Family Medicine

## 2019-05-22 LAB — SAR COV2 SEROLOGY (COVID19)AB(IGG),IA: SARS CoV2 AB IGG: NEGATIVE

## 2019-06-14 ENCOUNTER — Encounter: Payer: Self-pay | Admitting: Family Medicine

## 2019-06-15 ENCOUNTER — Other Ambulatory Visit: Payer: Self-pay

## 2019-06-15 MED ORDER — OMEPRAZOLE 20 MG PO CPDR: 20.0000 mg | DELAYED_RELEASE_CAPSULE | Freq: Every day | ORAL | 0 refills | Status: DC

## 2019-06-15 MED ORDER — SPIRONOLACTONE 25 MG PO TABS: 25.0000 mg | ORAL_TABLET | Freq: Every day | ORAL | 0 refills | Status: DC

## 2019-06-28 ENCOUNTER — Encounter: Payer: Self-pay | Admitting: Family Medicine

## 2019-06-28 ENCOUNTER — Ambulatory Visit (INDEPENDENT_AMBULATORY_CARE_PROVIDER_SITE_OTHER): Payer: Managed Care, Other (non HMO) | Admitting: Family Medicine

## 2019-06-28 ENCOUNTER — Other Ambulatory Visit: Payer: Self-pay

## 2019-06-28 VITALS — BP 120/88 | HR 85 | Temp 97.3°F | Ht 65.5 in | Wt 285.0 lb

## 2019-06-28 DIAGNOSIS — Z23 Encounter for immunization: Secondary | ICD-10-CM | POA: Diagnosis not present

## 2019-06-28 DIAGNOSIS — Z0000 Encounter for general adult medical examination without abnormal findings: Secondary | ICD-10-CM

## 2019-06-28 DIAGNOSIS — M25572 Pain in left ankle and joints of left foot: Secondary | ICD-10-CM

## 2019-06-28 DIAGNOSIS — B079 Viral wart, unspecified: Secondary | ICD-10-CM

## 2019-06-28 DIAGNOSIS — G8929 Other chronic pain: Secondary | ICD-10-CM

## 2019-06-28 DIAGNOSIS — D126 Benign neoplasm of colon, unspecified: Secondary | ICD-10-CM

## 2019-06-28 LAB — CBC WITH DIFFERENTIAL/PLATELET
Basophils Absolute: 0.1 10*3/uL (ref 0.0–0.1)
Basophils Relative: 0.7 % (ref 0.0–3.0)
Eosinophils Absolute: 0.2 10*3/uL (ref 0.0–0.7)
Eosinophils Relative: 2.6 % (ref 0.0–5.0)
HCT: 43.2 % (ref 36.0–46.0)
Hemoglobin: 14.4 g/dL (ref 12.0–15.0)
Lymphocytes Relative: 23.1 % (ref 12.0–46.0)
Lymphs Abs: 1.9 10*3/uL (ref 0.7–4.0)
MCHC: 33.2 g/dL (ref 30.0–36.0)
MCV: 90.2 fl (ref 78.0–100.0)
Monocytes Absolute: 0.8 10*3/uL (ref 0.1–1.0)
Monocytes Relative: 9.8 % (ref 3.0–12.0)
Neutro Abs: 5.4 10*3/uL (ref 1.4–7.7)
Neutrophils Relative %: 63.8 % (ref 43.0–77.0)
Platelets: 351 10*3/uL (ref 150.0–400.0)
RBC: 4.79 Mil/uL (ref 3.87–5.11)
RDW: 14 % (ref 11.5–15.5)
WBC: 8.4 10*3/uL (ref 4.0–10.5)

## 2019-06-28 LAB — LIPID PANEL
Cholesterol: 195 mg/dL (ref 0–200)
HDL: 39.3 mg/dL (ref >39.00)
LDL Cholesterol: 118 mg/dL — ABNORMAL HIGH (ref 0–99)
NonHDL: 155.7
Total CHOL/HDL Ratio: 5
Triglycerides: 188 mg/dL — ABNORMAL HIGH (ref 0.0–149.0)
VLDL: 37.6 mg/dL (ref 0.0–40.0)

## 2019-06-28 LAB — BASIC METABOLIC PANEL
BUN: 19 mg/dL (ref 6–23)
CO2: 28 mEq/L (ref 19–32)
Calcium: 10 mg/dL (ref 8.4–10.5)
Chloride: 101 mEq/L (ref 96–112)
Creatinine, Ser: 0.63 mg/dL (ref 0.40–1.20)
GFR: 99.46 mL/min (ref >60.00)
Glucose, Bld: 99 mg/dL (ref 70–99)
Potassium: 5.1 mEq/L (ref 3.5–5.1)
Sodium: 137 mEq/L (ref 135–145)

## 2019-06-28 LAB — HEPATIC FUNCTION PANEL
ALT: 14 U/L (ref 0–35)
AST: 14 U/L (ref 0–37)
Albumin: 4.2 g/dL (ref 3.5–5.2)
Alkaline Phosphatase: 67 U/L (ref 39–117)
Bilirubin, Direct: 0.1 mg/dL (ref 0.0–0.3)
Total Bilirubin: 0.7 mg/dL (ref 0.2–1.2)
Total Protein: 7.3 g/dL (ref 6.0–8.3)

## 2019-06-28 LAB — HEMOGLOBIN A1C: Hgb A1c MFr Bld: 5.8 % (ref 4.6–6.5)

## 2019-06-28 LAB — TSH: TSH: 3.51 u[IU]/mL (ref 0.35–4.50)

## 2019-06-28 NOTE — Progress Notes (Signed)
Subjective:     Patient ID: Abigail [name], female   DOB: Jan 24, 1966, 54 y.o.   MRN: [id number]  HPI Abigail [name] seen for physical exam.  She sees gynecologist and states that her Pap smear and mammogram are up-to-date.  She has history of adenomatous colon polyps by most recent colonoscopy last September.  Recommended 3-year follow-up.  She has had some ongoing left ankle pain and occasional left foot pain.  She thinks this may be arthritis related.  She has had difficulty walking and exercising because of her ankle.  She would like to consider podiatry referral for her foot and ankle pain.  No history of shingles vaccine.  Tetanus up-to-date.  Family history significant for mother having type 2 diabetes.  She died of complications of diabetes in her 79s.  She has another separate item from her physical and that she has several warts that have popped up on her right hand recently.  She has 1 on the palm 2 on the middle finger and one on the fifth finger laterally.  She had some similar warts treated with cryotherapy years ago which seem to work and she would like to proceed that route.  Past Medical History:  Diagnosis Date  . Asthma   . Blood transfusion without reported diagnosis   . Bursitis   . GERD (gastroesophageal reflux disease)   . Hemorrhoids   . Osteoarthritis   . Osteoporosis   . Tinnitus   . UTI (lower urinary tract infection)    Past Surgical History:  Procedure Laterality Date  . CERVICAL POLYPECTOMY    . TOTAL HIP ARTHROPLASTY  10/2008 & 06/2010   bilateral  . TOTAL HIP ARTHROPLASTY     2012-left  . WISDOM TOOTH EXTRACTION      reports that she has never smoked. She has never used smokeless tobacco. She reports that she does not drink alcohol or use drugs. family history includes Bipolar disorder in her mother; Breast cancer in an other family member; Diabetes in her mother; Hypertension in her mother; Lung cancer in her paternal grandfather; Prostate cancer in her  father; Stroke in her maternal grandfather. No Known Allergies   Review of Systems  Constitutional: Negative for activity change, appetite change, fatigue, fever and unexpected weight change.  HENT: Negative for ear pain, hearing loss, sore throat and trouble swallowing.   Eyes: Negative for visual disturbance.  Respiratory: Negative for cough and shortness of breath.   Cardiovascular: Negative for chest pain and palpitations.  Gastrointestinal: Negative for abdominal pain, blood in stool, constipation and diarrhea.  Genitourinary: Negative for dysuria and hematuria.  Musculoskeletal: Positive for arthralgias. Negative for back pain and myalgias.  Skin: Negative for rash.  Neurological: Negative for dizziness, syncope, weakness and headaches.  Hematological: Negative for adenopathy.  Psychiatric/Behavioral: Negative for confusion and dysphoric mood.       Objective:   Physical Exam Vitals reviewed.  Constitutional:      Appearance: She is well-developed.  HENT:     Head: Normocephalic and atraumatic.  Eyes:     Pupils: Pupils are equal, round, and reactive to light.  Neck:     Thyroid: No thyromegaly.  Cardiovascular:     Rate and Rhythm: Normal rate and regular rhythm.     Heart sounds: Normal heart sounds. No murmur.  Pulmonary:     Effort: No respiratory distress.     Breath sounds: Normal breath sounds. No wheezing or rales.  Abdominal:     General: Bowel sounds are normal.  There is no distension.     Palpations: Abdomen is soft. There is no mass.     Tenderness: There is no abdominal tenderness. There is no guarding or rebound.  Musculoskeletal:        General: Normal range of motion.     Cervical back: Normal range of motion and neck supple.  Lymphadenopathy:     Cervical: No cervical adenopathy.  Skin:    Findings: No rash.     Comments: She has some scattered common warts all about 3 mm diameter including one on the palm of the right hand, one on the lateral  aspect of the fifth digit right hand, and 2 on the volar aspect of the middle digit distal to the DIP joint.  Neurological:     Mental Status: She is alert and oriented to person, place, and time.     Cranial Nerves: No cranial nerve deficit.     Deep Tendon Reflexes: Reflexes normal.  Psychiatric:        Behavior: Behavior normal.        Thought Content: Thought content normal.        Judgment: Judgment normal.        Assessment:     #1 physical exam.  We discussed the following health maintenance issues.  She has significant obesity and is struggled to lose weight.  #2 left ankle and foot pain.  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  She is requesting podiatry referral  #3 Common warts involving right hand-total of 4  #4 history of colon adenomas-we will be getting 3-year follow-up colonoscopy from last colonoscopy this past September    Plan:     -Set up podiatry referral regarding her chronic left foot and ankle pain  -Discussed risk and benefits of treatment with liquid nitrogen for hand warts including risk of pain and blistering and she consented.  Total of 4 warts were treated with liquid nitrogen and patient tolerated well.  She will be in touch if these or not fully resolving over the next 2 to 3 weeks  -Obtain screening labs.  Include A1c with her family history of type 2 diabetes.  -We discussed other alternative forms of exercise such as swimming since she has difficulty with weightbearing such as ambulation  Eulas Post MD [facility]

## 2019-06-28 NOTE — Patient Instructions (Addendum)

## 2019-07-08 ENCOUNTER — Telehealth: Payer: Self-pay | Admitting: Family Medicine

## 2019-07-08 NOTE — Telephone Encounter (Signed)
Pt came in on 06/28/19 for a wellness visit and left a form that has to be faxed back to insurance in regards to the visit. She needs that done asap they have a time frame to have them in by. Once completed pt would like a mychart message confirming.

## 2019-07-09 NOTE — Telephone Encounter (Signed)
Called patient and let her know that I have faxed this form for her. She will call back if they do not receive.

## 2019-07-09 NOTE — Telephone Encounter (Signed)
This form has been faxed on 07/07/2019 with a confirmation that it has been faxed. I will call patient to let her know.

## 2019-09-15 ENCOUNTER — Ambulatory Visit (INDEPENDENT_AMBULATORY_CARE_PROVIDER_SITE_OTHER): Payer: Managed Care, Other (non HMO)

## 2019-09-15 ENCOUNTER — Other Ambulatory Visit: Payer: Self-pay

## 2019-09-15 ENCOUNTER — Ambulatory Visit: Payer: Managed Care, Other (non HMO) | Admitting: Podiatry

## 2019-09-15 VITALS — Temp 98.5°F

## 2019-09-15 DIAGNOSIS — M25572 Pain in left ankle and joints of left foot: Secondary | ICD-10-CM

## 2019-09-15 DIAGNOSIS — M79672 Pain in left foot: Secondary | ICD-10-CM | POA: Diagnosis not present

## 2019-09-15 DIAGNOSIS — M19072 Primary osteoarthritis, left ankle and foot: Secondary | ICD-10-CM | POA: Diagnosis not present

## 2019-09-15 DIAGNOSIS — M79671 Pain in right foot: Secondary | ICD-10-CM | POA: Diagnosis not present

## 2019-09-15 DIAGNOSIS — M19071 Primary osteoarthritis, right ankle and foot: Secondary | ICD-10-CM

## 2019-09-15 DIAGNOSIS — M778 Other enthesopathies, not elsewhere classified: Secondary | ICD-10-CM

## 2019-09-15 MED ORDER — CELECOXIB 100 MG PO CAPS: 100.0000 mg | ORAL_CAPSULE | Freq: Two times a day (BID) | ORAL | 3 refills | Status: DC

## 2019-09-18 NOTE — Progress Notes (Signed)
   HPI: 54 y.o. female presenting today as a new patient with a chief complaint of intense sharp pain to the bilateral feet and ankles, left worse than right, that has been gradually worsening over the past five years. She states she was seen at [facility] in 2019 and was told she had a bone spur in the left foot. Walking and moving the feet increases the pain. She has not had any treatment for her symptoms. Patient is here for further evaluation and treatment.   Past Medical History:  Diagnosis Date  . Asthma   . Blood transfusion without reported diagnosis   . Bursitis   . GERD (gastroesophageal reflux disease)   . Hemorrhoids   . Osteoarthritis   . Osteoporosis   . Tinnitus   . UTI (lower urinary tract infection)      Physical Exam: General: The patient is alert and oriented x3 in no acute distress.  Dermatology: Skin is warm, dry and supple bilateral lower extremities. Negative for open lesions or macerations.  Vascular: Palpable pedal pulses bilaterally. No edema or erythema noted. Capillary refill within normal limits.  Neurological: Epicritic and protective threshold grossly intact bilaterally.   Musculoskeletal Exam: Pain with palpation noted to the bilateral midfoot. Range of motion within normal limits to all pedal and ankle joints bilateral. Muscle strength 5/5 in all groups bilateral.   Radiographic Exam:  Degenerative changes with joint space narrowing noted to the bilateral midfoot joints.    Assessment: 1. DJD bilateral feet 2. Capsulitis bilateral feet    Plan of Care:  1. Patient evaluated. X-Rays reviewed.  2. Appointment with Pedorthist for custom molded orthotics.  3. Prescription for Celebrex 100 mg BID provided to patient.  4. Return to clinic as needed.   Currently not working.       Edrick Kins, DPM [facility]  Dr. Edrick Kins, DPM    [address], [place] [address]                 Office [phone]  Fax [phone]

## 2019-10-05 ENCOUNTER — Telehealth: Payer: Self-pay | Admitting: Family Medicine

## 2019-10-05 ENCOUNTER — Other Ambulatory Visit: Payer: Self-pay

## 2019-10-05 MED ORDER — SPIRONOLACTONE 25 MG PO TABS: 25.0000 mg | ORAL_TABLET | Freq: Every day | ORAL | 2 refills | Status: DC

## 2019-10-05 MED ORDER — OMEPRAZOLE 20 MG PO CPDR: 20.0000 mg | DELAYED_RELEASE_CAPSULE | Freq: Every day | ORAL | 2 refills | Status: AC

## 2019-10-05 NOTE — Telephone Encounter (Signed)
Pt is requesting a 90 days supply refill on Omeprazole 20 mg and Spironolactone 25 mg. Pt uses [facility] [address]vel

## 2019-10-05 NOTE — Telephone Encounter (Signed)
Pt would like to know if she received her #1 shingle vaccine in March? If so, when does she need to schedule her #2 shingle vaccine? Please advise. Thanks

## 2019-10-05 NOTE — Telephone Encounter (Signed)
Refills sent in

## 2019-10-05 NOTE — Telephone Encounter (Signed)
Pt given information that she did have first shot in march and can get 2nd between 2 to 6 months after. Pt will call back to schedule nurse visit

## 2019-10-11 ENCOUNTER — Other Ambulatory Visit: Payer: Self-pay

## 2019-10-11 ENCOUNTER — Ambulatory Visit (INDEPENDENT_AMBULATORY_CARE_PROVIDER_SITE_OTHER): Payer: Managed Care, Other (non HMO) | Admitting: Orthotics

## 2019-10-11 DIAGNOSIS — M778 Other enthesopathies, not elsewhere classified: Secondary | ICD-10-CM

## 2019-10-11 DIAGNOSIS — M19071 Primary osteoarthritis, right ankle and foot: Secondary | ICD-10-CM

## 2019-10-11 DIAGNOSIS — M79672 Pain in left foot: Secondary | ICD-10-CM

## 2019-10-11 DIAGNOSIS — M19072 Primary osteoarthritis, left ankle and foot: Secondary | ICD-10-CM

## 2019-10-11 DIAGNOSIS — M25572 Pain in left ankle and joints of left foot: Secondary | ICD-10-CM

## 2019-10-11 DIAGNOSIS — M216X9 Other acquired deformities of unspecified foot: Secondary | ICD-10-CM

## 2019-10-11 NOTE — Progress Notes (Signed)
Cast today for custom foot orthotics to address midfoot pain.

## 2019-10-18 ENCOUNTER — Ambulatory Visit (INDEPENDENT_AMBULATORY_CARE_PROVIDER_SITE_OTHER): Payer: Managed Care, Other (non HMO)

## 2019-10-18 ENCOUNTER — Other Ambulatory Visit: Payer: Self-pay

## 2019-10-18 DIAGNOSIS — Z23 Encounter for immunization: Secondary | ICD-10-CM

## 2019-10-18 NOTE — Progress Notes (Signed)
Per orders of Dr. Elease Hashimoto, injection of Shingrix  given by Wyvonne Lenz. Patient tolerated injection well.

## 2019-11-02 ENCOUNTER — Other Ambulatory Visit: Payer: Self-pay

## 2019-11-02 ENCOUNTER — Ambulatory Visit: Payer: Managed Care, Other (non HMO) | Admitting: Orthotics

## 2019-11-02 DIAGNOSIS — M79672 Pain in left foot: Secondary | ICD-10-CM

## 2019-11-02 DIAGNOSIS — M25572 Pain in left ankle and joints of left foot: Secondary | ICD-10-CM

## 2019-11-02 DIAGNOSIS — M19072 Primary osteoarthritis, left ankle and foot: Secondary | ICD-10-CM

## 2019-11-02 NOTE — Progress Notes (Signed)
Patient came in today to pick up custom made foot orthotics.  The goals were accomplished and the patient reported no dissatisfaction with said orthotics.  Patient was advised of breakin period and how to report any issues. 

## 2019-12-06 ENCOUNTER — Ambulatory Visit: Payer: Managed Care, Other (non HMO) | Admitting: Podiatry

## 2020-06-16 ENCOUNTER — Other Ambulatory Visit: Payer: Self-pay | Admitting: Family Medicine

## 2020-07-17 ENCOUNTER — Encounter: Payer: Self-pay | Admitting: Family Medicine

## 2020-07-17 ENCOUNTER — Other Ambulatory Visit: Payer: Self-pay

## 2020-07-17 ENCOUNTER — Ambulatory Visit (INDEPENDENT_AMBULATORY_CARE_PROVIDER_SITE_OTHER): Payer: Managed Care, Other (non HMO) | Admitting: Family Medicine

## 2020-07-17 VITALS — BP 130/74 | HR 103 | Temp 98.1°F | Ht 65.75 in | Wt 289.3 lb

## 2020-07-17 DIAGNOSIS — Z0001 Encounter for general adult medical examination with abnormal findings: Secondary | ICD-10-CM | POA: Diagnosis not present

## 2020-07-17 DIAGNOSIS — B079 Viral wart, unspecified: Secondary | ICD-10-CM

## 2020-07-17 DIAGNOSIS — Z0000 Encounter for general adult medical examination without abnormal findings: Secondary | ICD-10-CM

## 2020-07-17 LAB — BASIC METABOLIC PANEL
BUN: 26 mg/dL — ABNORMAL HIGH (ref 6–23)
CO2: 27 mEq/L (ref 19–32)
Calcium: 9.6 mg/dL (ref 8.4–10.5)
Chloride: 104 mEq/L (ref 96–112)
Creatinine, Ser: 0.57 mg/dL (ref 0.40–1.20)
GFR: 104.44 mL/min (ref >60.00)
Glucose, Bld: 105 mg/dL — ABNORMAL HIGH (ref 70–99)
Potassium: 4.6 mEq/L (ref 3.5–5.1)
Sodium: 139 mEq/L (ref 135–145)

## 2020-07-17 LAB — LIPID PANEL
Cholesterol: 195 mg/dL (ref 0–200)
HDL: 41.6 mg/dL (ref >39.00)
NonHDL: 153.89
Total CHOL/HDL Ratio: 5
Triglycerides: 236 mg/dL — ABNORMAL HIGH (ref 0.0–149.0)
VLDL: 47.2 mg/dL — ABNORMAL HIGH (ref 0.0–40.0)

## 2020-07-17 LAB — LDL CHOLESTEROL, DIRECT: Direct LDL: 127 mg/dL

## 2020-07-17 LAB — CBC WITH DIFFERENTIAL/PLATELET
Basophils Absolute: 0.1 10*3/uL (ref 0.0–0.1)
Basophils Relative: 0.7 % (ref 0.0–3.0)
Eosinophils Absolute: 0.4 10*3/uL (ref 0.0–0.7)
Eosinophils Relative: 4.7 % (ref 0.0–5.0)
HCT: 42.8 % (ref 36.0–46.0)
Hemoglobin: 14.1 g/dL (ref 12.0–15.0)
Lymphocytes Relative: 24.9 % (ref 12.0–46.0)
Lymphs Abs: 2 10*3/uL (ref 0.7–4.0)
MCHC: 32.9 g/dL (ref 30.0–36.0)
MCV: 88.8 fl (ref 78.0–100.0)
Monocytes Absolute: 0.7 10*3/uL (ref 0.1–1.0)
Monocytes Relative: 9.2 % (ref 3.0–12.0)
Neutro Abs: 4.8 10*3/uL (ref 1.4–7.7)
Neutrophils Relative %: 60.5 % (ref 43.0–77.0)
Platelets: 323 10*3/uL (ref 150.0–400.0)
RBC: 4.81 Mil/uL (ref 3.87–5.11)
RDW: 13.9 % (ref 11.5–15.5)
WBC: 7.9 10*3/uL (ref 4.0–10.5)

## 2020-07-17 LAB — TSH: TSH: 2.34 u[IU]/mL (ref 0.35–4.50)

## 2020-07-17 LAB — HEPATIC FUNCTION PANEL
ALT: 15 U/L (ref 0–35)
AST: 14 U/L (ref 0–37)
Albumin: 4.4 g/dL (ref 3.5–5.2)
Alkaline Phosphatase: 58 U/L (ref 39–117)
Bilirubin, Direct: 0.1 mg/dL (ref 0.0–0.3)
Total Bilirubin: 0.5 mg/dL (ref 0.2–1.2)
Total Protein: 7 g/dL (ref 6.0–8.3)

## 2020-07-17 LAB — HEMOGLOBIN A1C: Hgb A1c MFr Bld: 6 % (ref 4.6–6.5)

## 2020-07-17 NOTE — Patient Instructions (Signed)
Preventive Care 53-53 Years Old, Female Preventive care refers to lifestyle choices and visits with your health care provider that can promote health and wellness. This includes:  A yearly physical exam. This is also called an annual wellness visit.  Regular dental and eye exams.  Immunizations.  Screening for certain conditions.  Healthy lifestyle choices, such as: ? Eating a healthy diet. ? Getting regular exercise. ? Not using drugs or products that contain nicotine and tobacco. ? Limiting alcohol use. What can I expect for my preventive care visit? Physical exam Your health care provider will check your:  Height and weight. These may be used to calculate your BMI (body mass index). BMI is a measurement that tells if you are at a healthy weight.  Heart rate and blood pressure.  Body temperature.  Skin for abnormal spots. Counseling Your health care provider may ask you questions about your:  Past medical problems.  Family's medical history.  Alcohol, tobacco, and drug use.  Emotional well-being.  Home life and relationship well-being.  Sexual activity.  Diet, exercise, and sleep habits.  Work and work Statistician.  Access to firearms.  Method of birth control.  Menstrual cycle.  Pregnancy history. What immunizations do I need? Vaccines are usually given at various ages, according to a schedule. Your health care provider will recommend vaccines for you based on your age, medical history, and lifestyle or other factors, such as travel or where you work.   What tests do I need? Blood tests  Lipid and cholesterol levels. These may be checked every 5 years, or more often if you are over 53 years old.  Hepatitis C test.  Hepatitis B test. Screening  Lung cancer screening. You may have this screening every year starting at age 53 if you have a 30-pack-year history of smoking and currently smoke or have quit within the past 15 years.  Colorectal cancer  screening. ? All adults should have this screening starting at age 53 and continuing until age 17. ? Your health care provider may recommend screening at age 53 if you are at increased risk. ? You will have tests every 1-10 years, depending on your results and the type of screening test.  Diabetes screening. ? This is done by checking your blood sugar (glucose) after you have not eaten for a while (fasting). ? You may have this done every 1-3 years.  Mammogram. ? This may be done every 1-2 years. ? Talk with your health care provider about when you should start having regular mammograms. This may depend on whether you have a family history of breast cancer.  BRCA-related cancer screening. This may be done if you have a family history of breast, ovarian, tubal, or peritoneal cancers.  Pelvic exam and Pap test. ? This may be done every 3 years starting at age 53. ? Starting at age 11, this may be done every 5 years if you have a Pap test in combination with an HPV test. Other tests  STD (sexually transmitted disease) testing, if you are at risk.  Bone density scan. This is done to screen for osteoporosis. You may have this scan if you are at high risk for osteoporosis. Talk with your health care provider about your test results, treatment options, and if necessary, the need for more tests. Follow these instructions at home: Eating and drinking  Eat a diet that includes fresh fruits and vegetables, whole grains, lean protein, and low-fat dairy products.  Take vitamin and mineral supplements  as recommended by your health care provider.  Do not drink alcohol if: ? Your health care provider tells you not to drink. ? You are pregnant, may be pregnant, or are planning to become pregnant.  If you drink alcohol: ? Limit how much you have to 0-1 drink a day. ? Be aware of how much alcohol is in your drink. In the [place] one drink equals one 12 oz bottle of beer (355 mL), one 5 oz glass of  wine (148 mL), or one 1 oz glass of hard liquor (44 mL).   Lifestyle  Take daily care of your teeth and gums. Brush your teeth every morning and night with fluoride toothpaste. Floss one time each day.  Stay active. Exercise for at least 30 minutes 5 or more days each week.  Do not use any products that contain nicotine or tobacco, such as cigarettes, e-cigarettes, and chewing tobacco. If you need help quitting, ask your health care provider.  Do not use drugs.  If you are sexually active, practice safe sex. Use a condom or other form of protection to prevent STIs (sexually transmitted infections).  If you do not wish to become pregnant, use a form of birth control. If you plan to become pregnant, see your health care provider for a prepregnancy visit.  If told by your health care provider, take low-dose aspirin daily starting at age 50.  Find healthy ways to cope with stress, such as: ? Meditation, yoga, or listening to music. ? Journaling. ? Talking to a trusted person. ? Spending time with friends and family. Safety  Always wear your seat belt while driving or riding in a vehicle.  Do not drive: ? If you have been drinking alcohol. Do not ride with someone who has been drinking. ? When you are tired or distracted. ? While texting.  Wear a helmet and other protective equipment during sports activities.  If you have firearms in your house, make sure you follow all gun safety procedures. What's next?  Visit your health care provider once a year for an annual wellness visit.  Ask your health care provider how often you should have your eyes and teeth checked.  Stay up to date on all vaccines. This information is not intended to replace advice given to you by your health care provider. Make sure you discuss any questions you have with your health care provider. Document Revised: 01/18/2020 Document Reviewed: 12/25/2017 Elsevier Patient Education  2021 [employer]  

## 2020-07-17 NOTE — Progress Notes (Signed)
Established Patient Office Visit  Subjective:  Patient ID: Abigail [name], female    DOB: 1966-04-15  Age: 55 y.o. MRN: [id number]  CC:  Chief Complaint  Patient presents with  . Annual Exam    HPI Abigail [name] presents for physical exam.  She has had very stressful year.  She has had 3 different family members that died of Covid.  Her brother in law recently had cardiac arrest and is recovering from that.  She has been very busy with trying to tie up details with regard to estate issues for an aunt that died of Covid.  Abigail [name] still sees GYN regularly.  She does have history of hypertension.  She is on Aldactone.  She has separate problem of common warts left hand.  She has had these treated with liquid nitrogen in the past and responded favorably.  She is requesting these be treated today.  Health maintenance reviewed  -No history of hepatitis C screening but low risk -Gets Pap smears and mammograms through GYN and she states these are up-to-date -Declined flu vaccine -Repeat colonoscopy due 9/23 -Tetanus due 2029 -COVID vaccines up-to-date  Family history-mother had type 2 diabetes hypertension and question of bipolar disorder.  Father is alive and 62.  He has had history of prostate cancer.  She has a sister who has osteoarthritis.  Brother with type 2 diabetes.  Social history-married.  No children.  Currently no regular employment.  Never smoked.  Rare alcohol.  Past Medical History:  Diagnosis Date  . Asthma   . Blood transfusion without reported diagnosis   . Bursitis   . GERD (gastroesophageal reflux disease)   . Hemorrhoids   . Osteoarthritis   . Osteoporosis   . Tinnitus   . UTI (lower urinary tract infection)     Past Surgical History:  Procedure Laterality Date  . CERVICAL POLYPECTOMY    . TOTAL HIP ARTHROPLASTY  10/2008 & 06/2010   bilateral  . TOTAL HIP ARTHROPLASTY     2012-left  . WISDOM TOOTH EXTRACTION      Family History  Problem Relation Age  of Onset  . Hypertension Mother   . Diabetes Mother   . Bipolar disorder Mother   . Breast cancer Other        maternal great grandmother  . Lung cancer Paternal Grandfather   . Prostate cancer Father   . Stroke Maternal Grandfather   . Arthritis Sister   . Diabetes Brother   . Colon cancer Neg Hx   . Colon polyps Neg Hx   . Esophageal cancer Neg Hx   . Rectal cancer Neg Hx   . Stomach cancer Neg Hx     Social History   Socioeconomic History  . Marital status: Married    Spouse name: Not on file  . Number of children: 0  . Years of education: Not on file  . Highest education level: Not on file  Occupational History  . Occupation: Lexicographer: [employer]   Tobacco Use  . Smoking status: Never Smoker  . Smokeless tobacco: Never Used  Vaping Use  . Vaping Use: Never used  Substance and Sexual Activity  . Alcohol use: No  . Drug use: No  . Sexual activity: Not on file  Other Topics Concern  . Not on file  Social History Narrative  . Not on file   Social Determinants of Health   Financial Resource Strain: Not on file  Food Insecurity: Not  on file  Transportation Needs: Not on file  Physical Activity: Not on file  Stress: Not on file  Social Connections: Not on file  Intimate Partner Violence: Not on file    Outpatient Medications Prior to Visit  Medication Sig Dispense Refill  . amoxicillin (AMOXIL) 500 MG capsule Take before dental procedure    . Biotin 5 MG CAPS Take 1 capsule by mouth daily. Patient takes 500 mcg    . celecoxib (CELEBREX) 100 MG capsule Take 1 capsule (100 mg total) by mouth 2 (two) times daily. 60 capsule 3  . Glucosamine HCl 1000 MG TABS Take by mouth.    Marland Kitchen ibuprofen (ADVIL) 200 MG tablet Take 200 mg by mouth as needed.    Marland Kitchen ketoconazole (NIZORAL) 2 % shampoo Apply topically 2 (two) times a week. 120 mL 2  . Lysine 500 MG CAPS Take 1 capsule by mouth daily.    . Misc Natural Products (GLUCOSAMINE CHOND COMPLEX/MSM PO)  Take by mouth.    . Multiple Vitamin (MULTIVITAMIN) tablet Take 1 tablet by mouth daily.    . NON FORMULARY Patient takes beet root powder    . omeprazole (PRILOSEC) 20 MG capsule Take 1 capsule (20 mg total) by mouth daily. 90 capsule 2  . OVER THE COUNTER MEDICATION Take 2 capsules by mouth at bedtime.    Marland Kitchen OVER THE COUNTER MEDICATION Take 1 capsule by mouth at bedtime.    Marland Kitchen spironolactone (ALDACTONE) 25 MG tablet TAKE 1 TABLET BY MOUTH EVERY DAY 90 tablet 2  . VITAMIN D PO Take by mouth.     No facility-administered medications prior to visit.    No Known Allergies  ROS Review of Systems  Constitutional: Negative for activity change, appetite change, fatigue, fever and unexpected weight change.  HENT: Negative for ear pain, hearing loss, sore throat and trouble swallowing.   Eyes: Negative for visual disturbance.  Respiratory: Negative for cough and shortness of breath.   Cardiovascular: Negative for chest pain and palpitations.  Gastrointestinal: Negative for abdominal pain, blood in stool, constipation and diarrhea.  Genitourinary: Negative for dysuria and hematuria.  Musculoskeletal: Negative for arthralgias, back pain and myalgias.  Skin: Negative for rash.  Neurological: Negative for dizziness, syncope and headaches.  Hematological: Negative for adenopathy.  Psychiatric/Behavioral: Negative for confusion and dysphoric mood.      Objective:    Physical Exam Constitutional:      Appearance: She is well-developed.  HENT:     Head: Normocephalic and atraumatic.  Eyes:     Pupils: Pupils are equal, round, and reactive to light.  Neck:     Thyroid: No thyromegaly.  Cardiovascular:     Rate and Rhythm: Normal rate and regular rhythm.     Heart sounds: Normal heart sounds. No murmur heard.   Pulmonary:     Effort: No respiratory distress.     Breath sounds: Normal breath sounds. No wheezing or rales.  Abdominal:     General: Bowel sounds are normal. There is no  distension.     Palpations: Abdomen is soft. There is no mass.     Tenderness: There is no abdominal tenderness. There is no guarding or rebound.  Musculoskeletal:        General: Normal range of motion.     Cervical back: Normal range of motion and neck supple.  Lymphadenopathy:     Cervical: No cervical adenopathy.  Skin:    Findings: No rash.     Comments: She has a  couple of classic warts on left hand- both volar surface and about 5-6 mm diameter.  One is medial left palm and other is 5th digit near PIP joint.    Neurological:     Mental Status: She is alert and oriented to person, place, and time.     Cranial Nerves: No cranial nerve deficit.     Deep Tendon Reflexes: Reflexes normal.  Psychiatric:        Behavior: Behavior normal.        Thought Content: Thought content normal.        Judgment: Judgment normal.     BP 130/74   Pulse (!) 103   Temp 98.1 F (36.7 C) (Oral)   Ht 5' 5.75" (1.67 m)   Wt 289 lb 5 oz (131.2 kg)   SpO2 98%   BMI 47.05 kg/m  Wt Readings from Last 3 Encounters:  07/17/20 289 lb 5 oz (131.2 kg)  06/28/19 285 lb (129.3 kg)  01/18/19 282 lb (127.9 kg)     Health Maintenance Due  Topic Date Due  . Hepatitis C Screening  Never done  . HIV Screening  Never done  . MAMMOGRAM  01/01/2020    There are no preventive care reminders to display for this patient.  Lab Results  Component Value Date   TSH 3.51 06/28/2019   Lab Results  Component Value Date   WBC 8.4 06/28/2019   HGB 14.4 06/28/2019   HCT 43.2 06/28/2019   MCV 90.2 06/28/2019   PLT 351.0 06/28/2019   Lab Results  Component Value Date   NA 137 06/28/2019   K 5.1 06/28/2019   CO2 28 06/28/2019   GLUCOSE 99 06/28/2019   BUN 19 06/28/2019   CREATININE 0.63 06/28/2019   BILITOT 0.7 06/28/2019   ALKPHOS 67 06/28/2019   AST 14 06/28/2019   ALT 14 06/28/2019   PROT 7.3 06/28/2019   ALBUMIN 4.2 06/28/2019   CALCIUM 10.0 06/28/2019   GFR 99.46 06/28/2019   Lab Results   Component Value Date   CHOL 195 06/28/2019   Lab Results  Component Value Date   HDL 39.30 06/28/2019   Lab Results  Component Value Date   LDLCALC 118 (H) 06/28/2019   Lab Results  Component Value Date   TRIG 188.0 (H) 06/28/2019   Lab Results  Component Value Date   CHOLHDL 5 06/28/2019   Lab Results  Component Value Date   HGBA1C 5.8 06/28/2019      Assessment & Plan:   #1 physical exam.  Hx of obesity and hypertension which is controlled with FH of type 2 diabetes. -We discussed checking labs including A1c with her family history of type 2 diabetes  -She will continue GYN follow-up for Pap smears and mammograms  -Check hepatitis C antibody  -Patient declined flu vaccine this year  -Repeat colonoscopy 2023  -encourage weight loss.  #2 Common warts left hand.  She has 2 warts on left hand and she request treatment with liquid nitrogen.  We discussed risk and benefits including risk of blistering, pain, low risk of infection and scarring.  Patient consented.  Using liquid nitrogen we treated 2 warts one on the left palm of the hand and one at the volar base of the fifth digit near the PIP joint Tolerated procedure well.  No orders of the defined types were placed in this encounter.   Follow-up: No follow-ups on file.    Carolann Littler, MD

## 2020-07-18 LAB — HEPATITIS C ANTIBODY
Hepatitis C Ab: NONREACTIVE
SIGNAL TO CUT-OFF: 0.01 (ref <1.00)

## 2020-08-04 ENCOUNTER — Other Ambulatory Visit: Payer: Self-pay

## 2020-08-04 ENCOUNTER — Telehealth (INDEPENDENT_AMBULATORY_CARE_PROVIDER_SITE_OTHER): Payer: Managed Care, Other (non HMO) | Admitting: Family Medicine

## 2020-08-04 DIAGNOSIS — J069 Acute upper respiratory infection, unspecified: Secondary | ICD-10-CM | POA: Diagnosis not present

## 2020-08-04 MED ORDER — HYDROCODONE-HOMATROPINE 5-1.5 MG/5ML PO SYRP: 5.0000 mL | ORAL_SOLUTION | Freq: Four times a day (QID) | ORAL | 0 refills | Status: AC | PRN

## 2020-08-04 NOTE — Progress Notes (Signed)
Patient ID: Abigail [name], female   DOB: Sep 19, 1965, 55 y.o.   MRN: [id number]  This visit type was conducted due to national recommendations for restrictions regarding the COVID-19 pandemic in an effort to limit this patient's exposure and mitigate transmission in our community.   Virtual Visit via Video Note  I connected with Abigail [name] on 08/04/20 at  9:00 AM EDT by a video enabled telemedicine application and verified that I am speaking with the correct person using two identifiers.  Location patient: home Location provider:work or home office Persons participating in the virtual visit: patient, provider  I discussed the limitations of evaluation and management by telemedicine and the availability of in person appointments. The patient expressed understanding and agreed to proceed.   HPI:  Abigail [name] is seen with onset 2 days ago of upper respiratory symptoms.  She states that she helped take care of her 84 year old nephew last Thursday and Friday.  He developed some respiratory symptoms then.  Abigail [name] noted Wednesday some nasal congestion, headaches, body aches, and cough.  By Wednesday night she had temperature 100.1.  She has had no fever since then.  She is taken some over-the-counter analgesics.  She been taking Mucinex DM cough syrup but still coughing severely at night.  Very little sleep last night secondary to cough.  Cough mostly nonproductive.  No nausea, vomiting, or diarrhea.  She did home Covid test which came back negative.  She is not aware of any significant dyspnea or wheezing.   ROS: See pertinent positives and negatives per HPI.  Past Medical History:  Diagnosis Date  . Asthma   . Blood transfusion without reported diagnosis   . Bursitis   . GERD (gastroesophageal reflux disease)   . Hemorrhoids   . Osteoarthritis   . Osteoporosis   . Tinnitus   . UTI (lower urinary tract infection)     Past Surgical History:  Procedure Laterality Date  . CERVICAL POLYPECTOMY     . TOTAL HIP ARTHROPLASTY  10/2008 & 06/2010   bilateral  . TOTAL HIP ARTHROPLASTY     2012-left  . WISDOM TOOTH EXTRACTION      Family History  Problem Relation Age of Onset  . Hypertension Mother   . Diabetes Mother   . Bipolar disorder Mother   . Breast cancer Other        maternal great grandmother  . Lung cancer Paternal Grandfather   . Prostate cancer Father   . Stroke Maternal Grandfather   . Arthritis Sister   . Diabetes Brother   . Colon cancer Neg Hx   . Colon polyps Neg Hx   . Esophageal cancer Neg Hx   . Rectal cancer Neg Hx   . Stomach cancer Neg Hx     SOCIAL HX: Non-smoker   Current Outpatient Medications:  .  amoxicillin (AMOXIL) 500 MG capsule, Take before dental procedure, Disp: , Rfl:  .  Biotin 5 MG CAPS, Take 1 capsule by mouth daily. Patient takes 500 mcg, Disp: , Rfl:  .  celecoxib (CELEBREX) 100 MG capsule, Take 1 capsule (100 mg total) by mouth 2 (two) times daily., Disp: 60 capsule, Rfl: 3 .  Glucosamine HCl 1000 MG TABS, Take by mouth., Disp: , Rfl:  .  HYDROcodone-homatropine (HYCODAN) 5-1.5 MG/5ML syrup, Take 5 mLs by mouth every 6 (six) hours as needed for up to 10 days., Disp: 120 mL, Rfl: 0 .  ibuprofen (ADVIL) 200 MG tablet, Take 200 mg by mouth as needed., Disp: ,  Rfl:  .  ketoconazole (NIZORAL) 2 % shampoo, Apply topically 2 (two) times a week., Disp: 120 mL, Rfl: 2 .  Lysine 500 MG CAPS, Take 1 capsule by mouth daily., Disp: , Rfl:  .  Misc Natural Products (GLUCOSAMINE CHOND COMPLEX/MSM PO), Take by mouth., Disp: , Rfl:  .  Multiple Vitamin (MULTIVITAMIN) tablet, Take 1 tablet by mouth daily., Disp: , Rfl:  .  NON FORMULARY, Patient takes beet root powder, Disp: , Rfl:  .  omeprazole (PRILOSEC) 20 MG capsule, Take 1 capsule (20 mg total) by mouth daily., Disp: 90 capsule, Rfl: 2 .  OVER THE COUNTER MEDICATION, Take 2 capsules by mouth at bedtime., Disp: , Rfl:  .  OVER THE COUNTER MEDICATION, Take 1 capsule by mouth at bedtime., Disp: ,  Rfl:  .  spironolactone (ALDACTONE) 25 MG tablet, TAKE 1 TABLET BY MOUTH EVERY DAY, Disp: 90 tablet, Rfl: 2 .  VITAMIN D PO, Take by mouth., Disp: , Rfl:   EXAM:  VITALS per patient if applicable:  GENERAL: alert, oriented, appears well and in no acute distress  HEENT: atraumatic, conjunttiva clear, no obvious abnormalities on inspection of external nose and ears  NECK: normal movements of the head and neck  LUNGS: on inspection no signs of respiratory distress, breathing rate appears normal, no obvious gross SOB, gasping or wheezing  CV: no obvious cyanosis  MS: moves all visible extremities without noticeable abnormality  PSYCH/NEURO: pleasant and cooperative, no obvious depression or anxiety, speech and thought processing grossly intact  ASSESSMENT AND PLAN:  Discussed the following assessment and plan:  Probable viral URI with cough.  Home Covid test negative.  Patient in no distress.  She is currently afebrile.  -We discussed plenty of fluids and rest -Continue over-the-counter analgesics such as Tylenol or Advil for body aches -Limited Hycodan cough syrup 1 teaspoon nightly for severe cough -Continue over-the-counter Mucinex -Follow-up for any recurrent fever or other concerns     I discussed the assessment and treatment plan with the patient. The patient was provided an opportunity to ask questions and all were answered. The patient agreed with the plan and demonstrated an understanding of the instructions.   The patient was advised to call back or seek an in-person evaluation if the symptoms worsen or if the condition fails to improve as anticipated.     Carolann Littler, MD

## 2020-08-14 ENCOUNTER — Other Ambulatory Visit: Payer: Self-pay

## 2020-08-14 ENCOUNTER — Ambulatory Visit: Payer: Managed Care, Other (non HMO) | Admitting: Podiatry

## 2020-08-14 ENCOUNTER — Ambulatory Visit (INDEPENDENT_AMBULATORY_CARE_PROVIDER_SITE_OTHER): Payer: Managed Care, Other (non HMO)

## 2020-08-14 DIAGNOSIS — B070 Plantar wart: Secondary | ICD-10-CM

## 2020-08-14 NOTE — Progress Notes (Signed)
   Subjective: 55 y.o. female presenting today for evaluation of the symptomatic skin lesion to the plantar aspect of the left foot has been going on for several weeks now.  Patient states that she does have a history recently of plantar verruca lesions that have developed to the hands bilaterally.  She has been having them treated and she is concerned that possibly this is a wart on her left foot.  She presents for further treatment and evaluation   Past Medical History:  Diagnosis Date  . Asthma   . Blood transfusion without reported diagnosis   . Bursitis   . GERD (gastroesophageal reflux disease)   . Hemorrhoids   . Osteoarthritis   . Osteoporosis   . Tinnitus   . UTI (lower urinary tract infection)     Objective: Physical Exam General: The patient is alert and oriented x3 in no acute distress.   Dermatology: Hyperkeratotic skin lesion(s) noted to the plantar aspect of the left foot approximately 1 cm in diameter. Pinpoint bleeding noted upon debridement. Skin is warm, dry and supple bilateral lower extremities. Negative for open lesions or macerations.   Vascular: Palpable pedal pulses bilaterally. No edema or erythema noted. Capillary refill within normal limits.   Neurological: Epicritic and protective threshold grossly intact bilaterally.    Musculoskeletal Exam: Pain on palpation to the noted skin lesion(s).  Range of motion within normal limits to all pedal and ankle joints bilateral. Muscle strength 5/5 in all groups bilateral.    Assessment: #1 plantar wart left foot   Plan of Care:  #1 Patient was evaluated. #2 Excisional debridement of the plantar wart lesion(s) was performed using a chisel blade.  Salicylic acid was applied and the lesion(s) was dressed with a dry sterile dressing. #3 recommend salicylic acid daily x4 weeks  #4 patient is to return to clinic as needed  Edrick Kins, DPM [facility]  Dr. Edrick Kins, DPM    [address], [place] [address]                Office [phone]  Fax [phone]

## 2021-02-26 ENCOUNTER — Other Ambulatory Visit: Payer: Self-pay | Admitting: Obstetrics and Gynecology

## 2021-02-26 DIAGNOSIS — R928 Other abnormal and inconclusive findings on diagnostic imaging of breast: Secondary | ICD-10-CM

## 2021-03-13 ENCOUNTER — Other Ambulatory Visit: Payer: Self-pay

## 2021-03-13 ENCOUNTER — Ambulatory Visit
Admission: RE | Admit: 2021-03-13 | Discharge: 2021-03-13 | Disposition: A | Discharge status: Home / Self Care | Payer: Managed Care, Other (non HMO) | Source: Ambulatory Visit | Attending: Obstetrics and Gynecology | Admitting: Obstetrics and Gynecology

## 2021-03-13 ENCOUNTER — Other Ambulatory Visit: Payer: Self-pay | Admitting: Obstetrics and Gynecology

## 2021-03-13 DIAGNOSIS — R928 Other abnormal and inconclusive findings on diagnostic imaging of breast: Secondary | ICD-10-CM

## 2021-03-19 ENCOUNTER — Other Ambulatory Visit: Payer: Managed Care, Other (non HMO)

## 2021-03-19 ENCOUNTER — Other Ambulatory Visit: Payer: Self-pay | Admitting: Family Medicine

## 2021-07-18 ENCOUNTER — Encounter: Payer: Self-pay | Admitting: Family Medicine

## 2021-07-18 ENCOUNTER — Ambulatory Visit (INDEPENDENT_AMBULATORY_CARE_PROVIDER_SITE_OTHER): Payer: 59 | Admitting: Family Medicine

## 2021-07-18 VITALS — BP 124/80 | HR 72 | Temp 98.0°F | Ht 65.0 in | Wt 269.1 lb

## 2021-07-18 DIAGNOSIS — Z0000 Encounter for general adult medical examination without abnormal findings: Secondary | ICD-10-CM | POA: Diagnosis not present

## 2021-07-18 LAB — CBC WITH DIFFERENTIAL/PLATELET
Basophils Absolute: 0.1 10*3/uL (ref 0.0–0.1)
Basophils Relative: 0.8 % (ref 0.0–3.0)
Eosinophils Absolute: 0.3 10*3/uL (ref 0.0–0.7)
Eosinophils Relative: 3.3 % (ref 0.0–5.0)
HCT: 41.6 % (ref 36.0–46.0)
Hemoglobin: 13.9 g/dL (ref 12.0–15.0)
Lymphocytes Relative: 33.8 % (ref 12.0–46.0)
Lymphs Abs: 2.6 10*3/uL (ref 0.7–4.0)
MCHC: 33.3 g/dL (ref 30.0–36.0)
MCV: 88.8 fl (ref 78.0–100.0)
Monocytes Absolute: 0.8 10*3/uL (ref 0.1–1.0)
Monocytes Relative: 10.5 % (ref 3.0–12.0)
Neutro Abs: 3.9 10*3/uL (ref 1.4–7.7)
Neutrophils Relative %: 51.6 % (ref 43.0–77.0)
Platelets: 298 10*3/uL (ref 150.0–400.0)
RBC: 4.68 Mil/uL (ref 3.87–5.11)
RDW: 13.6 % (ref 11.5–15.5)
WBC: 7.7 10*3/uL (ref 4.0–10.5)

## 2021-07-18 LAB — TSH: TSH: 4.67 u[IU]/mL (ref 0.35–5.50)

## 2021-07-18 LAB — BASIC METABOLIC PANEL
BUN: 21 mg/dL (ref 6–23)
CO2: 27 mEq/L (ref 19–32)
Calcium: 9.8 mg/dL (ref 8.4–10.5)
Chloride: 103 mEq/L (ref 96–112)
Creatinine, Ser: 0.62 mg/dL (ref 0.40–1.20)
GFR: 101.63 mL/min (ref >60.00)
Glucose, Bld: 97 mg/dL (ref 70–99)
Potassium: 4.4 mEq/L (ref 3.5–5.1)
Sodium: 137 mEq/L (ref 135–145)

## 2021-07-18 LAB — LIPID PANEL
Cholesterol: 193 mg/dL (ref 0–200)
HDL: 44.5 mg/dL (ref >39.00)
LDL Cholesterol: 115 mg/dL — ABNORMAL HIGH (ref 0–99)
NonHDL: 148.65
Total CHOL/HDL Ratio: 4
Triglycerides: 170 mg/dL — ABNORMAL HIGH (ref 0.0–149.0)
VLDL: 34 mg/dL (ref 0.0–40.0)

## 2021-07-18 LAB — HEPATIC FUNCTION PANEL
ALT: 15 U/L (ref 0–35)
AST: 17 U/L (ref 0–37)
Albumin: 4.4 g/dL (ref 3.5–5.2)
Alkaline Phosphatase: 63 U/L (ref 39–117)
Bilirubin, Direct: 0.1 mg/dL (ref 0.0–0.3)
Total Bilirubin: 0.5 mg/dL (ref 0.2–1.2)
Total Protein: 7.2 g/dL (ref 6.0–8.3)

## 2021-07-18 LAB — HEMOGLOBIN A1C: Hgb A1c MFr Bld: 6.1 % (ref 4.6–6.5)

## 2021-07-18 NOTE — Progress Notes (Signed)
? ?Established Patient Office Visit ? ?Subjective:  ?Patient ID: Abigail [name], female    DOB: 1965-05-11  Age: 56 y.o. MRN: [id number] ? ?CC:  ?Chief Complaint  ?Patient presents with  ? Annual Exam  ? ? ?HPI ?Abigail [name] presents for physical exam.  She brings in a form that needs to be completed for her insurance.  Generally doing well.  She has done excellent job of weight loss.  She actually lost about 25 pounds but is gained 5 pounds back.  She is using my fitness pal app which has helped.  She does have very strong family history of type 2 diabetes as below.  Sees gynecologist regularly.  She is on Aldactone.  Has had some bilateral tinnitus over the past few years.  No sudden hearing loss.  No unilateral symptoms.  No vertigo.  Pap smear mammogram up-to-date.  Health maintenance: ? ?-Needs repeat colonoscopy this September ?-Tetanus due 2029 ?-Shingles vaccine complete ?-Prior hepatitis C screen negative ?-Mammogram and Pap smear up-to-date ? ?Family history-mother had hypertension, type 2 diabetes, probable bipolar disorder.  Father still alive age 53.  He has history of prostate cancer.  Brother with type 2 diabetes. ? ?Social history-married.  No children.  Non-smoker.  Rare alcohol.  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 ?Past Medical History:  ?Diagnosis Date  ? Asthma   ? Blood transfusion without reported diagnosis   ? Bursitis   ? GERD (gastroesophageal reflux disease)   ? Hemorrhoids   ? Osteoarthritis   ? Osteoporosis   ? Tinnitus   ? UTI (lower urinary tract infection)   ? ? ?Past Surgical History:  ?Procedure Laterality Date  ? CERVICAL POLYPECTOMY    ? TOTAL HIP ARTHROPLASTY  10/2008 & 06/2010  ? bilateral  ? TOTAL HIP ARTHROPLASTY    ? 2012-left  ? WISDOM TOOTH EXTRACTION    ? ? ?Family History  ?Problem Relation Age of Onset  ? Hypertension Mother   ? Diabetes Mother   ? Bipolar disorder Mother   ? Prostate cancer Father   ? Arthritis Sister   ? Stroke Maternal  Grandfather   ? Lung cancer Paternal Grandfather   ? Diabetes Brother   ? Colon cancer Neg Hx   ? Colon polyps Neg Hx   ? Esophageal cancer Neg Hx   ? Rectal cancer Neg Hx   ? Stomach cancer Neg Hx   ? ? ?Social History  ? ?Socioeconomic History  ? Marital status: Married  ?  Spouse name: Not on file  ? Number of children: 0  ? Years of education: Not on file  ? Highest education level: Not on file  ?Occupational History  ? Occupation: Hospital doctor  ?  Employer: [employer] cabin [employer]   ?Tobacco Use  ? Smoking status: Never  ? Smokeless tobacco: Never  ?Vaping Use  ? Vaping Use: Never used  ?Substance and Sexual Activity  ? Alcohol use: No  ? Drug use: No  ? Sexual activity: Not on file  ?Other Topics Concern  ? Not on file  ?Social History Narrative  ? Not on file  ? ?Social Determinants of Health  ? ?Financial Resource Strain: Not on file  ?Food Insecurity: Not on file  ?Transportation Needs: Not on file  ?Physical Activity: Not on file  ?Stress: Not on file  ?Social Connections: Not on file  ?Intimate Partner Violence: Not on file  ? ? ?Outpatient Medications Prior to Visit  ?Medication Sig Dispense  Refill  ? amoxicillin (AMOXIL) 500 MG capsule Take before dental procedure    ? Biotin 5 MG CAPS Take 1 capsule by mouth daily. Patient takes 500 mcg    ? Glucosamine HCl 1000 MG TABS Take by mouth.    ? ibuprofen (ADVIL) 200 MG tablet Take 200 mg by mouth as needed.    ? ketoconazole (NIZORAL) 2 % shampoo Apply topically 2 (two) times a week. 120 mL 2  ? Lysine 500 MG CAPS Take 1 capsule by mouth daily.    ? Misc Natural Products (GLUCOSAMINE CHOND COMPLEX/MSM PO) Take by mouth.    ? Multiple Vitamin (MULTIVITAMIN) tablet Take 1 tablet by mouth daily.    ? NON FORMULARY Patient takes beet root powder    ? omeprazole (PRILOSEC) 20 MG capsule Take 1 capsule (20 mg total) by mouth daily. 90 capsule 2  ? OVER THE COUNTER MEDICATION Take 2 capsules by mouth at bedtime.    ? OVER THE COUNTER MEDICATION Take 1 capsule by mouth  at bedtime.    ? spironolactone (ALDACTONE) 25 MG tablet TAKE 1 TABLET BY MOUTH EVERY DAY 90 tablet 2  ? VITAMIN D PO Take by mouth.    ? celecoxib (CELEBREX) 100 MG capsule Take 1 capsule (100 mg total) by mouth 2 (two) times daily. 60 capsule 3  ? ?No facility-administered medications prior to visit.  ? ? ?No Known Allergies ? ?ROS ?Review of Systems  ?Constitutional:  Negative for activity change, appetite change, fatigue, fever and unexpected weight change.  ?HENT:  Positive for tinnitus. Negative for ear pain, hearing loss, sore throat and trouble swallowing.   ?Eyes:  Negative for visual disturbance.  ?Respiratory:  Negative for cough and shortness of breath.   ?Cardiovascular:  Negative for chest pain and palpitations.  ?Gastrointestinal:  Negative for abdominal pain, blood in stool, constipation and diarrhea.  ?Genitourinary:  Negative for dysuria and hematuria.  ?Musculoskeletal:  Positive for arthralgias. Negative for back pain and myalgias.  ?Skin:  Negative for rash.  ?Neurological:  Negative for dizziness, syncope and headaches.  ?Hematological:  Negative for adenopathy.  ?Psychiatric/Behavioral:  Negative for confusion and dysphoric mood.   ? ?  ?Objective:  ?  ?Physical Exam ?Constitutional:   ?   Appearance: She is well-developed.  ?HENT:  ?   Right Ear: Tympanic membrane normal.  ?   Left Ear: Tympanic membrane normal.  ?Eyes:  ?   Pupils: Pupils are equal, round, and reactive to light.  ?Neck:  ?   Thyroid: No thyromegaly.  ?   Vascular: No JVD.  ?Cardiovascular:  ?   Rate and Rhythm: Normal rate and regular rhythm.  ?   Heart sounds:  ?  No gallop.  ?Pulmonary:  ?   Effort: Pulmonary effort is normal. No respiratory distress.  ?   Breath sounds: Normal breath sounds. No wheezing or rales.  ?Musculoskeletal:  ?   Cervical back: Neck supple.  ?   Right lower leg: No edema.  ?   Left lower leg: No edema.  ?Neurological:  ?   General: No focal deficit present.  ?   Mental Status: She is alert.   ?Psychiatric:     ?   Mood and Affect: Mood normal.     ?   Thought Content: Thought content normal.  ? ? ?BP 124/80 (BP Location: Left Arm, Patient Position: Sitting, Cuff Size: Large)   Pulse 72   Temp 98 ?F (36.7 ?C) (Oral)   Ht  $'5\' 5"'v$  (1.651 m)   Wt 269 lb 1.6 oz (122.1 kg)   SpO2 97%   BMI 44.78 kg/m?  ?Wt Readings from Last 3 Encounters:  ?07/18/21 269 lb 1.6 oz (122.1 kg)  ?07/17/20 289 lb 5 oz (131.2 kg)  ?06/28/19 285 lb (129.3 kg)  ? ? ? ?Health Maintenance Due  ?Topic Date Due  ? HIV Screening  Never done  ? COVID-19 Vaccine (3 - Booster for Pfizer series) 03/22/2020  ? ? ?There are no preventive care reminders to display for this patient. ? ?Lab Results  ?Component Value Date  ? TSH 2.34 07/17/2020  ? ?Lab Results  ?Component Value Date  ? WBC 7.9 07/17/2020  ? HGB 14.1 07/17/2020  ? HCT 42.8 07/17/2020  ? MCV 88.8 07/17/2020  ? PLT 323.0 07/17/2020  ? ?Lab Results  ?Component Value Date  ? NA 139 07/17/2020  ? K 4.6 07/17/2020  ? CO2 27 07/17/2020  ? GLUCOSE 105 (H) 07/17/2020  ? BUN 26 (H) 07/17/2020  ? CREATININE 0.57 07/17/2020  ? BILITOT 0.5 07/17/2020  ? ALKPHOS 58 07/17/2020  ? AST 14 07/17/2020  ? ALT 15 07/17/2020  ? PROT 7.0 07/17/2020  ? ALBUMIN 4.4 07/17/2020  ? CALCIUM 9.6 07/17/2020  ? GFR 104.44 07/17/2020  ? ?Lab Results  ?Component Value Date  ? CHOL 195 07/17/2020  ? ?Lab Results  ?Component Value Date  ? HDL 41.60 07/17/2020  ? ?Lab Results  ?Component Value Date  ? LDLCALC 118 (H) 06/28/2019  ? ?Lab Results  ?Component Value Date  ? TRIG 236.0 (H) 07/17/2020  ? ?Lab Results  ?Component Value Date  ? CHOLHDL 5 07/17/2020  ? ?Lab Results  ?Component Value Date  ? HGBA1C 6.0 07/17/2020  ? ? ?  ?Assessment & Plan:  ? ?Problem List Items Addressed This Visit   ?None ?Visit Diagnoses   ? ? Physical exam    -  Primary  ? Relevant Orders  ? Basic metabolic panel  ? Lipid panel  ? CBC with Differential/Platelet  ? TSH  ? Hepatic function panel  ? Hemoglobin A1c  ? ?  ?She has done  excellent job with weight loss during the past year and is encouraged to continue with this. ? ?-Check screening labs as above.  Include A1c with her strong family history of type 2 diabetes ? ?-She plans to continue annual [place]

## 2021-07-18 NOTE — Patient Instructions (Signed)
Remember need for repeat colonoscopy in September.    ? ?Keep up weight loss efforts.    ?

## 2021-09-17 ENCOUNTER — Ambulatory Visit
Admission: RE | Admit: 2021-09-17 | Discharge: 2021-09-17 | Disposition: A | Discharge status: Home / Self Care | Payer: Managed Care, Other (non HMO) | Source: Ambulatory Visit | Attending: Obstetrics and Gynecology | Admitting: Obstetrics and Gynecology

## 2021-09-17 ENCOUNTER — Ambulatory Visit
Admission: RE | Admit: 2021-09-17 | Discharge: 2021-09-17 | Disposition: A | Discharge status: Home / Self Care | Payer: 59 | Source: Ambulatory Visit | Attending: Obstetrics and Gynecology | Admitting: Obstetrics and Gynecology

## 2021-09-17 DIAGNOSIS — R928 Other abnormal and inconclusive findings on diagnostic imaging of breast: Secondary | ICD-10-CM

## 2021-10-17 ENCOUNTER — Other Ambulatory Visit: Payer: Self-pay | Admitting: Obstetrics and Gynecology

## 2021-10-17 DIAGNOSIS — N6310 Unspecified lump in the right breast, unspecified quadrant: Secondary | ICD-10-CM

## 2021-10-17 DIAGNOSIS — N6320 Unspecified lump in the left breast, unspecified quadrant: Secondary | ICD-10-CM

## 2021-12-20 ENCOUNTER — Other Ambulatory Visit: Payer: Self-pay | Admitting: Family Medicine

## 2022-02-01 ENCOUNTER — Telehealth: Payer: Self-pay | Admitting: Family Medicine

## 2022-02-01 NOTE — Telephone Encounter (Signed)
Is going to start using womens Rogaine again and needs this shampoo ketoconazole (NIZORAL) 2 % shampoo    [id number]- [place], [place] - [address]:  [phone] Fax:  [phone]

## 2022-02-04 MED ORDER — KETOCONAZOLE 2 % EX SHAM: MEDICATED_SHAMPOO | CUTANEOUS | 2 refills | Status: AC

## 2022-02-04 NOTE — Telephone Encounter (Signed)
Rx sent 

## 2022-02-04 NOTE — Addendum Note (Signed)
Addended by: Nilda Riggs on: 02/04/2022 07:54 AM   Modules accepted: Orders

## 2022-02-19 ENCOUNTER — Encounter: Payer: Self-pay | Admitting: Gastroenterology

## 2022-03-25 ENCOUNTER — Other Ambulatory Visit: Payer: 59

## 2022-04-08 ENCOUNTER — Ambulatory Visit
Admission: RE | Admit: 2022-04-08 | Discharge: 2022-04-08 | Disposition: A | Discharge status: Home / Self Care | Payer: 59 | Source: Ambulatory Visit | Attending: Obstetrics and Gynecology | Admitting: Obstetrics and Gynecology

## 2022-04-08 DIAGNOSIS — N6320 Unspecified lump in the left breast, unspecified quadrant: Secondary | ICD-10-CM

## 2022-04-08 DIAGNOSIS — N6310 Unspecified lump in the right breast, unspecified quadrant: Secondary | ICD-10-CM

## 2022-06-18 ENCOUNTER — Other Ambulatory Visit: Payer: Self-pay | Admitting: Family Medicine

## 2022-07-22 ENCOUNTER — Encounter: Payer: 59 | Admitting: Family Medicine

## 2022-07-30 ENCOUNTER — Ambulatory Visit (INDEPENDENT_AMBULATORY_CARE_PROVIDER_SITE_OTHER): Payer: 59 | Admitting: Family

## 2022-07-30 ENCOUNTER — Encounter: Payer: Self-pay | Admitting: Family

## 2022-07-30 VITALS — BP 140/86 | HR 70 | Temp 98.1°F | Ht 64.96 in | Wt 271.9 lb

## 2022-07-30 DIAGNOSIS — R7309 Other abnormal glucose: Secondary | ICD-10-CM

## 2022-07-30 DIAGNOSIS — Z0000 Encounter for general adult medical examination without abnormal findings: Secondary | ICD-10-CM

## 2022-07-30 DIAGNOSIS — I10 Essential (primary) hypertension: Secondary | ICD-10-CM | POA: Diagnosis not present

## 2022-07-30 LAB — CBC WITH DIFFERENTIAL/PLATELET
Basophils Absolute: 0.1 10*3/uL (ref 0.0–0.1)
Basophils Relative: 0.9 % (ref 0.0–3.0)
Eosinophils Absolute: 0.3 10*3/uL (ref 0.0–0.7)
Eosinophils Relative: 5.3 % — ABNORMAL HIGH (ref 0.0–5.0)
HCT: 41.6 % (ref 36.0–46.0)
Hemoglobin: 13.9 g/dL (ref 12.0–15.0)
Lymphocytes Relative: 40.5 % (ref 12.0–46.0)
Lymphs Abs: 2.6 10*3/uL (ref 0.7–4.0)
MCHC: 33.3 g/dL (ref 30.0–36.0)
MCV: 89.9 fl (ref 78.0–100.0)
Monocytes Absolute: 0.6 10*3/uL (ref 0.1–1.0)
Monocytes Relative: 10.2 % (ref 3.0–12.0)
Neutro Abs: 2.7 10*3/uL (ref 1.4–7.7)
Neutrophils Relative %: 43.1 % (ref 43.0–77.0)
Platelets: 321 10*3/uL (ref 150.0–400.0)
RBC: 4.63 Mil/uL (ref 3.87–5.11)
RDW: 14 % (ref 11.5–15.5)
WBC: 6.3 10*3/uL (ref 4.0–10.5)

## 2022-07-30 LAB — COMPREHENSIVE METABOLIC PANEL
ALT: 17 U/L (ref 0–35)
AST: 22 U/L (ref 0–37)
Albumin: 4.5 g/dL (ref 3.5–5.2)
Alkaline Phosphatase: 56 U/L (ref 39–117)
BUN: 23 mg/dL (ref 6–23)
CO2: 28 mEq/L (ref 19–32)
Calcium: 9.6 mg/dL (ref 8.4–10.5)
Chloride: 105 mEq/L (ref 96–112)
Creatinine, Ser: 0.62 mg/dL (ref 0.40–1.20)
GFR: 100.89 mL/min (ref >60.00)
Glucose, Bld: 94 mg/dL (ref 70–99)
Potassium: 4.3 mEq/L (ref 3.5–5.1)
Sodium: 137 mEq/L (ref 135–145)
Total Bilirubin: 0.5 mg/dL (ref 0.2–1.2)
Total Protein: 7.4 g/dL (ref 6.0–8.3)

## 2022-07-30 LAB — LIPID PANEL
Cholesterol: 200 mg/dL (ref 0–200)
HDL: 45.8 mg/dL (ref >39.00)
LDL Cholesterol: 127 mg/dL — ABNORMAL HIGH (ref 0–99)
NonHDL: 154.16
Total CHOL/HDL Ratio: 4
Triglycerides: 135 mg/dL (ref 0.0–149.0)
VLDL: 27 mg/dL (ref 0.0–40.0)

## 2022-07-30 LAB — TSH: TSH: 3.77 u[IU]/mL (ref 0.35–5.50)

## 2022-07-30 LAB — HEMOGLOBIN A1C: Hgb A1c MFr Bld: 5.8 % (ref 4.6–6.5)

## 2022-07-30 NOTE — Progress Notes (Unsigned)
Complete physical exam  Patient: Abigail [name]   DOB: 04-23-1966   57 y.o. Female  MRN: [id number]  Subjective:    Chief Complaint  Patient presents with  . Annual Exam    Abigail [name] is a 57 y.o. female who presents today for a complete physical exam. She reports consuming a general diet. The patient does not participate in regular exercise at present. She generally feels well. She reports sleeping fairly well. She does not have additional problems to discuss today. Patient reports walking her dog approximately 20 min per day. She also admits to having difficulty sleeping due to caring for her mother in law who is often up several times per night. She also has a father who is not doing well for which she is caring for as well.   Patient plans to schedule her colonoscopy. Reports she does not need a referral. Mammogram is up to date.    Most recent fall risk assessment:     No data to display           Most recent depression screenings:    07/18/2021    8:40 AM 07/17/2020    9:24 AM  PHQ 2/9 Scores  PHQ - 2 Score 0 0        Patient Care Team: Eulas Post, MD as PCP - General   Outpatient Medications Prior to Visit  Medication Sig  . amoxicillin (AMOXIL) 500 MG capsule Take before dental procedure  . Biotin 5 MG CAPS Take 1 capsule by mouth daily. Patient takes 500 mcg  . Glucosamine HCl 1000 MG TABS Take by mouth.  Marland Kitchen ibuprofen (ADVIL) 200 MG tablet Take 200 mg by mouth as needed.  Marland Kitchen ketoconazole (NIZORAL) 2 % shampoo Apply topically 2 (two) times a week.  Marland Kitchen Lysine 500 MG CAPS Take 1 capsule by mouth daily.  . Misc Natural Products (GLUCOSAMINE CHOND COMPLEX/MSM PO) Take by mouth.  . Multiple Vitamin (MULTIVITAMIN) tablet Take 1 tablet by mouth daily.  . NON FORMULARY Patient takes beet root powder  . omeprazole (PRILOSEC) 20 MG capsule Take 1 capsule (20 mg total) by mouth daily.  Marland Kitchen OVER THE COUNTER MEDICATION Take 2 capsules by mouth at bedtime.  Marland Kitchen OVER  THE COUNTER MEDICATION Take 1 capsule by mouth at bedtime.  Marland Kitchen spironolactone (ALDACTONE) 25 MG tablet TAKE 1 TABLET BY MOUTH EVERY DAY  . VITAMIN D PO Take by mouth.   No facility-administered medications prior to visit.    Review of Systems  All other systems reviewed and are negative.        Objective:     BP (!) 140/86 (BP Location: Left Arm, Patient Position: Sitting, Cuff Size: Large)   Pulse 70   Temp 98.1 F (36.7 C) (Oral)   Ht 5' 4.96" (1.65 m)   Wt 271 lb 14.4 oz (123.3 kg)   SpO2 98%   BMI 45.30 kg/m    Physical Exam Vitals and nursing note reviewed.  Constitutional:      Appearance: Normal appearance. She is obese.  HENT:     Head: Normocephalic and atraumatic.     Right Ear: Tympanic membrane, ear canal and external ear normal.     Left Ear: Tympanic membrane, ear canal and external ear normal.     Nose: Nose normal.     Mouth/Throat:     Mouth: Mucous membranes are moist.  Eyes:     Extraocular Movements: Extraocular movements intact.     Conjunctiva/sclera: Conjunctivae  normal.     Pupils: Pupils are equal, round, and reactive to light.  Cardiovascular:     Rate and Rhythm: Normal rate and regular rhythm.     Pulses: Normal pulses.  Pulmonary:     Effort: Pulmonary effort is normal.     Breath sounds: Normal breath sounds.  Abdominal:     General: Bowel sounds are normal.     Palpations: Abdomen is soft. There is no mass.     Tenderness: There is no abdominal tenderness. There is no guarding or rebound.  Musculoskeletal:        General: Normal range of motion.     Cervical back: Normal range of motion and neck supple.  Skin:    General: Skin is warm.  Neurological:     General: No focal deficit present.     Mental Status: She is alert and oriented to person, place, and time.  Psychiatric:        Mood and Affect: Mood normal.        Behavior: Behavior normal.     No results found for any visits on 07/30/22.     Assessment & Plan:     Routine Health Maintenance and Physical Exam  Immunization History  Administered Date(s) Administered  . Influenza,inj,Quad PF,6+ Mos 01/04/2019  . Influenza-Unspecified 12/12/2018  . PFIZER(Purple Top)SARS-COV-2 Vaccination 01/05/2020, 01/26/2020  . Td 02/29/2008  . Tdap 06/20/2017  . Zoster Recombinat (Shingrix) 06/28/2019, 10/18/2019    Health Maintenance  Topic Date Due  . HIV Screening  Never done  . COVID-19 Vaccine (3 - 2023-24 season) 12/28/2021  . COLONOSCOPY (Pts 45-28yrs Insurance coverage will need to be confirmed)  01/17/2022  . INFLUENZA VACCINE  11/28/2022  . PAP SMEAR-Modifier  02/01/2024  . MAMMOGRAM  04/08/2024  . DTaP/Tdap/Td (3 - Td or Tdap) 06/21/2027  . Hepatitis C Screening  Completed  . Zoster Vaccines- Shingrix  Completed  . HPV VACCINES  Aged Out    Discussed health benefits of physical activity, and encouraged her to engage in regular exercise appropriate for her age and condition.  Problem List Items Addressed This Visit     Severe obesity (BMI >= 40)   Relevant Orders   Hemoglobin A1c   TSH   HYPERGLYCEMIA   Relevant Orders   Comprehensive metabolic panel   Hemoglobin A1c   Essential hypertension, benign   Relevant Orders   Comprehensive metabolic panel   CBC with Differential   Lipid panel   Other Visit Diagnoses     Routine general medical examination at a health care facility    -  Primary   Relevant Orders   Comprehensive metabolic panel   CBC with Differential   Hemoglobin A1c   Lipid panel   TSH      Return in 1 year (on 07/30/2023). Call the office with any questions or concerns.  Recheck as scheduled and sooner as needed.     Kennyth Arnold, FNP

## 2022-07-30 NOTE — Patient Instructions (Signed)
Health Maintenance, Female Adopting a healthy lifestyle and getting preventive care are important in promoting health and wellness. Ask your health care provider about: The right schedule for you to have regular tests and exams. Things you can do on your own to prevent diseases and keep yourself healthy. What should I know about diet, weight, and exercise? Eat a healthy diet  Eat a diet that includes plenty of vegetables, fruits, low-fat dairy products, and lean protein. Do not eat a lot of foods that are high in solid fats, added sugars, or sodium. Maintain a healthy weight Body mass index (BMI) is used to identify weight problems. It estimates body fat based on height and weight. Your health care provider can help determine your BMI and help you achieve or maintain a healthy weight. Get regular exercise Get regular exercise. This is one of the most important things you can do for your health. Most adults should: Exercise for at least 150 minutes each week. The exercise should increase your heart rate and make you sweat (moderate-intensity exercise). Do strengthening exercises at least twice a week. This is in addition to the moderate-intensity exercise. Spend less time sitting. Even light physical activity can be beneficial. Watch cholesterol and blood lipids Have your blood tested for lipids and cholesterol at 55 years of age, then have this test every 5 years. Have your cholesterol levels checked more often if: Your lipid or cholesterol levels are high. You are older than 55 years of age. You are at high risk for heart disease. What should I know about cancer screening? Depending on your health history and family history, you may need to have cancer screening at various ages. This may include screening for: Breast cancer. Cervical cancer. Colorectal cancer. Skin cancer. Lung cancer. What should I know about heart disease, diabetes, and high blood pressure? Blood pressure and heart  disease High blood pressure causes heart disease and increases the risk of stroke. This is more likely to develop in people who have high blood pressure readings or are overweight. Have your blood pressure checked: Every 3-5 years if you are 18-39 years of age. Every year if you are 40 years old or older. Diabetes Have regular diabetes screenings. This checks your fasting blood sugar level. Have the screening done: Once every three years after age 40 if you are at a normal weight and have a low risk for diabetes. More often and at a younger age if you are overweight or have a high risk for diabetes. What should I know about preventing infection? Hepatitis B If you have a higher risk for hepatitis B, you should be screened for this virus. Talk with your health care provider to find out if you are at risk for hepatitis B infection. Hepatitis C Testing is recommended for: Everyone born from 1945 through 1965. Anyone with known risk factors for hepatitis C. Sexually transmitted infections (STIs) Get screened for STIs, including gonorrhea and chlamydia, if: You are sexually active and are younger than 55 years of age. You are older than 55 years of age and your health care provider tells you that you are at risk for this type of infection. Your sexual activity has changed since you were last screened, and you are at increased risk for chlamydia or gonorrhea. Ask your health care provider if you are at risk. Ask your health care provider about whether you are at high risk for HIV. Your health care provider may recommend a prescription medicine to help prevent HIV   infection. If you choose to take medicine to prevent HIV, you should first get tested for HIV. You should then be tested every 3 months for as long as you are taking the medicine. Pregnancy If you are about to stop having your period (premenopausal) and you may become pregnant, seek counseling before you get pregnant. Take 400 to 800  micrograms (mcg) of folic acid every day if you become pregnant. Ask for birth control (contraception) if you want to prevent pregnancy. Osteoporosis and menopause Osteoporosis is a disease in which the bones lose minerals and strength with aging. This can result in bone fractures. If you are 65 years old or older, or if you are at risk for osteoporosis and fractures, ask your health care provider if you should: Be screened for bone loss. Take a calcium or vitamin D supplement to lower your risk of fractures. Be given hormone replacement therapy (HRT) to treat symptoms of menopause. Follow these instructions at home: Alcohol use Do not drink alcohol if: Your health care provider tells you not to drink. You are pregnant, may be pregnant, or are planning to become pregnant. If you drink alcohol: Limit how much you have to: 0-1 drink a day. Know how much alcohol is in your drink. In the [place] one drink equals one 12 oz bottle of beer (355 mL), one 5 oz glass of wine (148 mL), or one 1 oz glass of hard liquor (44 mL). Lifestyle Do not use any products that contain nicotine or tobacco. These products include cigarettes, chewing tobacco, and vaping devices, such as e-cigarettes. If you need help quitting, ask your health care provider. Do not use street drugs. Do not share needles. Ask your health care provider for help if you need support or information about quitting drugs. General instructions Schedule regular health, dental, and eye exams. Stay current with your vaccines. Tell your health care provider if: You often feel depressed. You have ever been abused or do not feel safe at home. Summary Adopting a healthy lifestyle and getting preventive care are important in promoting health and wellness. Follow your health care provider's instructions about healthy diet, exercising, and getting tested or screened for diseases. Follow your health care provider's instructions on monitoring your  cholesterol and blood pressure. This information is not intended to replace advice given to you by your health care provider. Make sure you discuss any questions you have with your health care provider. Document Revised: 09/04/2020 Document Reviewed: 09/04/2020 Elsevier Patient Education  2023 Elsevier Inc[employer]     Why follow it? Research shows. Those who follow the Mediterranean diet have a reduced risk of heart disease  The diet is associated with a reduced incidence of Parkinson's and Alzheimer's diseases People following the diet may have longer life expectancies and lower rates of chronic diseases  The Dietary Guidelines for Americans recommends the Mediterranean diet as an eating plan to promote health and prevent disease  What Is the Mediterranean Diet?  Healthy eating plan based on typical foods and recipes of Mediterranean-style cooking The diet is primarily a plant based diet; these foods should make up a majority of meals   Starches - Plant based foods should make up a majority of meals - They are an important sources of vitamins, minerals, energy, antioxidants, and fiber - Choose whole grains, foods high in fiber and minimally processed items  - Typical grain sources include wheat, oats, barley, corn, brown rice, bulgar, farro, millet, polenta, couscous  - Various types of beans   include chickpeas, lentils, fava beans, black beans, white beans   Fruits  Veggies - Large quantities of antioxidant rich fruits & veggies; 6 or more servings  - Vegetables can be eaten raw or lightly drizzled with oil and cooked  - Vegetables common to the traditional Mediterranean Diet include: artichokes, arugula, beets, broccoli, brussel sprouts, cabbage, carrots, celery, collard greens, cucumbers, eggplant, kale, leeks, lemons, lettuce, mushrooms, okra, onions, peas, peppers, potatoes, pumpkin, radishes, rutabaga, shallots, spinach, sweet potatoes, turnips, zucchini - Fruits common to the Mediterranean  Diet include: apples, apricots, avocados, cherries, clementines, dates, figs, grapefruits, grapes, melons, nectarines, oranges, peaches, pears, pomegranates, strawberries, tangerines  Fats - Replace butter and margarine with healthy oils, such as olive oil, canola oil, and tahini  - Limit nuts to no more than a handful a day  - Nuts include walnuts, almonds, pecans, pistachios, pine nuts  - Limit or avoid candied, honey roasted or heavily salted nuts - Olives are central to the Mediterranean diet - can be eaten whole or used in a variety of dishes   Meats Protein - Limiting red meat: no more than a few times a month - When eating red meat: choose lean cuts and keep the portion to the size of deck of cards - Eggs: approx. 0 to 4 times a week  - Fish and lean poultry: at least 2 a week  - Healthy protein sources include, chicken, turkey, lean beef, lamb - Increase intake of seafood such as tuna, salmon, trout, mackerel, shrimp, scallops - Avoid or limit high fat processed meats such as sausage and bacon  Dairy - Include moderate amounts of low fat dairy products  - Focus on healthy dairy such as fat free yogurt, skim milk, low or reduced fat cheese - Limit dairy products higher in fat such as whole or 2% milk, cheese, ice cream  Alcohol - Moderate amounts of red wine is ok  - No more than 5 oz daily for women (all ages) and men older than age 65  - No more than 10 oz of wine daily for men younger than 65  Other - Limit sweets and other desserts  - Use herbs and spices instead of salt to flavor foods  - Herbs and spices common to the traditional Mediterranean Diet include: basil, bay leaves, chives, cloves, cumin, fennel, garlic, lavender, marjoram, mint, oregano, parsley, pepper, rosemary, sage, savory, sumac, tarragon, thyme   It's not just a diet, it's a lifestyle:  The Mediterranean diet includes lifestyle factors typical of those in the region  Foods, drinks and meals are best eaten with  others and savored Daily physical activity is important for overall good health This could be strenuous exercise like running and aerobics This could also be more leisurely activities such as walking, housework, yard-work, or taking the stairs Moderation is the key; a balanced and healthy diet accommodates most foods and drinks Consider portion sizes and frequency of consumption of certain foods   Meal Ideas & Options:  Breakfast:  Whole wheat toast or whole wheat English muffins with peanut butter & hard boiled egg Steel cut oats topped with apples & cinnamon and skim milk  Fresh fruit: banana, strawberries, melon, berries, peaches  Smoothies: strawberries, bananas, greek yogurt, peanut butter Low fat greek yogurt with blueberries and granola  Egg white omelet with spinach and mushrooms Breakfast couscous: whole wheat couscous, apricots, skim milk, cranberries  Sandwiches:  Hummus and grilled vegetables (peppers, zucchini, squash) on whole wheat bread   Grilled   chicken on whole wheat pita with lettuce, tomatoes, cucumbers or tzatziki  Tuna salad on whole wheat bread: tuna salad made with greek yogurt, olives, red peppers, capers, green onions Garlic rosemary lamb pita: lamb sauted with garlic, rosemary, salt & pepper; add lettuce, cucumber, greek yogurt to pita - flavor with lemon juice and black pepper  Seafood:  Mediterranean grilled salmon, seasoned with garlic, basil, parsley, lemon juice and black pepper Shrimp, lemon, and spinach whole-grain pasta salad made with low fat greek yogurt  Seared scallops with lemon orzo  Seared tuna steaks seasoned salt, pepper, coriander topped with tomato mixture of olives, tomatoes, olive oil, minced garlic, parsley, green onions and cappers  Meats:  Herbed greek chicken salad with kalamata olives, cucumber, feta  Red bell peppers stuffed with spinach, bulgur, lean ground beef (or lentils) & topped with feta   Kebabs: skewers of chicken, tomatoes,  onions, zucchini, squash  Turkey burgers: made with red onions, mint, dill, lemon juice, feta cheese topped with roasted red peppers Vegetarian Cucumber salad: cucumbers, artichoke hearts, celery, red onion, feta cheese, tossed in olive oil & lemon juice  Hummus and whole grain pita points with a greek salad (lettuce, tomato, feta, olives, cucumbers, red onion) Lentil soup with celery, carrots made with vegetable broth, garlic, salt and pepper  Tabouli salad: parsley, bulgur, mint, scallions, cucumbers, tomato, radishes, lemon juice, olive oil, salt and pepper.      

## 2022-07-31 ENCOUNTER — Encounter: Payer: Self-pay | Admitting: Family

## 2022-09-12 ENCOUNTER — Other Ambulatory Visit: Payer: Self-pay | Admitting: Family Medicine

## 2023-04-01 ENCOUNTER — Telehealth (INDEPENDENT_AMBULATORY_CARE_PROVIDER_SITE_OTHER): Payer: 59 | Admitting: Family Medicine

## 2023-04-01 DIAGNOSIS — M25532 Pain in left wrist: Secondary | ICD-10-CM

## 2023-04-01 DIAGNOSIS — M25531 Pain in right wrist: Secondary | ICD-10-CM

## 2023-04-01 NOTE — Progress Notes (Signed)
Patient ID: Abigail [name], female   DOB: [birth year], 57 y.o.   MRN: [id number]  Virtual Visit via Video Note  I connected with Vikki Ports on 04/01/23 at  3:00 PM EST by a video enabled telemedicine application and verified that I am speaking with the correct person using two identifiers.  Location patient: home Location provider:work or home office Persons participating in the virtual visit: patient, provider  I discussed the limitations of evaluation and management by telemedicine and the availability of in person appointments. The patient expressed understanding and agreed to proceed.   HPI:  Set up virtual visit to discuss recent fall.  She was walking her dog a week ago today and he saw someone he knew and bolted and basically pulled her down.  She landed with both hands and wrist outstretched and landed on both palms and also her knees.  No loss of consciousness.  She was aware of some elbow and wrist pain afterwards bilaterally.  Able to bear weight without difficulty.  Prior history of bilateral total hip replacements.  She has osteoarthritis involving the right knee.  She was able to get up and walk home and applied some ice and took some Advil.  She had low suspicion for fracture.  No visible bruising or swelling.  Had a little bit of a popping sensation right shoulder but no significant right shoulder pain or weakness.  Grip okay.  No radial head tenderness at the elbow.  Soreness in both wrist gradually improving.  No localized bony tenderness over the distal ulnar or radius  ROS: See pertinent positives and negatives per HPI.  Past Medical History:  Diagnosis Date   Asthma    Blood transfusion without reported diagnosis    Bursitis    GERD (gastroesophageal reflux disease)    Hemorrhoids    Osteoarthritis    Osteoporosis    Tinnitus    UTI (lower urinary tract infection)     Past Surgical History:  Procedure Laterality Date   CERVICAL POLYPECTOMY     TOTAL HIP ARTHROPLASTY   10/2008 & 06/2010   bilateral   TOTAL HIP ARTHROPLASTY     2012-left   WISDOM TOOTH EXTRACTION      Family History  Problem Relation Age of Onset   Hypertension Mother    Diabetes Mother    Bipolar disorder Mother    Prostate cancer Father    Arthritis Sister    Stroke Maternal Grandfather    Lung cancer Paternal Grandfather    Diabetes Brother    Colon cancer Neg Hx    Colon polyps Neg Hx    Esophageal cancer Neg Hx    Rectal cancer Neg Hx    Stomach cancer Neg Hx     SOCIAL HX: Non-smoker   Current Outpatient Medications:    amoxicillin (AMOXIL) 500 MG capsule, Take before dental procedure, Disp: , Rfl:    Biotin 5 MG CAPS, Take 1 capsule by mouth daily. Patient takes 500 mcg, Disp: , Rfl:    ibuprofen (ADVIL) 200 MG tablet, Take 200 mg by mouth as needed., Disp: , Rfl:    ketoconazole (NIZORAL) 2 % shampoo, Apply topically 2 (two) times a week., Disp: 120 mL, Rfl: 2   Lysine 500 MG CAPS, Take 1 capsule by mouth daily., Disp: , Rfl:    Misc Natural Products (GLUCOSAMINE CHOND COMPLEX/MSM PO), Take by mouth., Disp: , Rfl:    Multiple Vitamin (MULTIVITAMIN) tablet, Take 1 tablet by mouth daily., Disp: , Rfl:  NON FORMULARY, Patient takes beet root powder, Disp: , Rfl:    omeprazole (PRILOSEC) 20 MG capsule, Take 1 capsule (20 mg total) by mouth daily., Disp: 90 capsule, Rfl: 2   OVER THE COUNTER MEDICATION, Take 2 capsules by mouth at bedtime., Disp: , Rfl:    OVER THE COUNTER MEDICATION, Take 1 capsule by mouth at bedtime., Disp: , Rfl:    spironolactone (ALDACTONE) 25 MG tablet, TAKE 1 TABLET BY MOUTH EVERY DAY, Disp: 90 tablet, Rfl: 3   VITAMIN D PO, Take by mouth., Disp: , Rfl:   EXAM:  VITALS per patient if applicable:  GENERAL: alert, oriented, appears well and in no acute distress  HEENT: atraumatic, conjunttiva clear, no obvious abnormalities on inspection of external nose and ears  NECK: normal movements of the head and neck  LUNGS: on inspection no signs  of respiratory distress, breathing rate appears normal, no obvious gross SOB, gasping or wheezing  CV: no obvious cyanosis  MS: moves all visible extremities without noticeable abnormality  PSYCH/NEURO: pleasant and cooperative, no obvious depression or anxiety, speech and thought processing grossly intact  ASSESSMENT AND PLAN:  Discussed the following assessment and plan:  Recent fall with mild bilateral wrist pain.  We talked her through screening for navicular fracture and she has no snuffbox tenderness.  No localized distal radial or ulnar tenderness.  Full range of motion both wrists.  No pain radial head region of elbow and full range of motion both elbows.  Low clinical suspicion for fracture.  Obviously, cannot adequately evaluate for fracture without office visit and exam  -Since her pain is improving and no visible swelling or bruising would recommend observation for now. -Set up office visit for exam and x-rays for any question regarding ongoing pain or worsening pain     I discussed the assessment and treatment plan with the patient. The patient was provided an opportunity to ask questions and all were answered. The patient agreed with the plan and demonstrated an understanding of the instructions.   The patient was advised to call back or seek an in-person evaluation if the symptoms worsen or if the condition fails to improve as anticipated.     Evelena Peat, MD

## 2023-04-09 ENCOUNTER — Telehealth: Payer: Self-pay | Admitting: Family Medicine

## 2023-04-09 MED ORDER — AMOXICILLIN 500 MG PO CAPS: ORAL_CAPSULE | ORAL | 0 refills | Status: DC

## 2023-04-09 NOTE — Telephone Encounter (Signed)
Patient confirmed no penicillin allergy. Rx sent

## 2023-04-09 NOTE — Telephone Encounter (Signed)
Has dentist appt Friday at 11, asking for prophylactic amoxicillin, says he needs it post hip replacement and the previous dentist wrote for her but she is at a new dentist now   [id number] - [place], [place] - [address] AT [facility] [address] Phone: [phone]  Fax: [phone]

## 2023-04-09 NOTE — Addendum Note (Signed)
Addended by: Christy Sartorius on: 04/09/2023 02:27 PM   Modules accepted: Orders

## 2023-04-16 NOTE — Progress Notes (Unsigned)
    Aleen Sells D.[facility] [address] [place] [address] Phone: [phone]   Assessment and Plan:     There are no diagnoses linked to this encounter.  ***   Pertinent previous records reviewed include ***    Follow Up: ***     Subjective:   I, Starlyn Droge, am serving as a Neurosurgeon for Doctor Richardean Sale  Chief Complaint: arm pain   HPI:   04/17/2023 Patient is a 57 year old female with arm pain. Patient states   Relevant Historical Information: ***  Additional pertinent review of systems negative.   Current Outpatient Medications:    amoxicillin (AMOXIL) 500 MG capsule, take 4 tablets 1 hour prior to dental procedure, Disp: 4 capsule, Rfl: 0   Biotin 5 MG CAPS, Take 1 capsule by mouth daily. Patient takes 500 mcg, Disp: , Rfl:    ibuprofen (ADVIL) 200 MG tablet, Take 200 mg by mouth as needed., Disp: , Rfl:    ketoconazole (NIZORAL) 2 % shampoo, Apply topically 2 (two) times a week., Disp: 120 mL, Rfl: 2   Lysine 500 MG CAPS, Take 1 capsule by mouth daily., Disp: , Rfl:    Misc Natural Products (GLUCOSAMINE CHOND COMPLEX/MSM PO), Take by mouth., Disp: , Rfl:    Multiple Vitamin (MULTIVITAMIN) tablet, Take 1 tablet by mouth daily., Disp: , Rfl:    NON FORMULARY, Patient takes beet root powder, Disp: , Rfl:    omeprazole (PRILOSEC) 20 MG capsule, Take 1 capsule (20 mg total) by mouth daily., Disp: 90 capsule, Rfl: 2   OVER THE COUNTER MEDICATION, Take 2 capsules by mouth at bedtime., Disp: , Rfl:    OVER THE COUNTER MEDICATION, Take 1 capsule by mouth at bedtime., Disp: , Rfl:    spironolactone (ALDACTONE) 25 MG tablet, TAKE 1 TABLET BY MOUTH EVERY DAY, Disp: 90 tablet, Rfl: 3   VITAMIN D PO, Take by mouth., Disp: , Rfl:    Objective:     There were no vitals filed for this visit.    There is no height or weight on file to calculate BMI.    Physical Exam:    ***   Electronically signed by:  Aleen Sells  D[facility] 7:49 AM 04/16/23

## 2023-04-17 ENCOUNTER — Ambulatory Visit: Payer: 59

## 2023-04-17 ENCOUNTER — Ambulatory Visit: Payer: 59 | Admitting: Sports Medicine

## 2023-04-17 VITALS — HR 95 | Ht 64.0 in | Wt 294.0 lb

## 2023-04-17 DIAGNOSIS — W19XXXA Unspecified fall, initial encounter: Secondary | ICD-10-CM

## 2023-04-17 DIAGNOSIS — M25522 Pain in left elbow: Secondary | ICD-10-CM

## 2023-04-17 DIAGNOSIS — M25511 Pain in right shoulder: Secondary | ICD-10-CM

## 2023-04-17 DIAGNOSIS — M25512 Pain in left shoulder: Secondary | ICD-10-CM

## 2023-04-17 DIAGNOSIS — S52124A Nondisplaced fracture of head of right radius, initial encounter for closed fracture: Secondary | ICD-10-CM | POA: Diagnosis not present

## 2023-04-17 DIAGNOSIS — M25521 Pain in right elbow: Secondary | ICD-10-CM

## 2023-04-17 DIAGNOSIS — W19XXXS Unspecified fall, sequela: Secondary | ICD-10-CM | POA: Diagnosis not present

## 2023-04-17 DIAGNOSIS — S52124S Nondisplaced fracture of head of right radius, sequela: Secondary | ICD-10-CM | POA: Diagnosis not present

## 2023-04-17 DIAGNOSIS — M25532 Pain in left wrist: Secondary | ICD-10-CM

## 2023-04-17 DIAGNOSIS — M25531 Pain in right wrist: Secondary | ICD-10-CM | POA: Diagnosis not present

## 2023-04-17 MED ORDER — MELOXICAM 15 MG PO TABS: 15.0000 mg | ORAL_TABLET | Freq: Every day | ORAL | 0 refills | Status: AC

## 2023-04-17 NOTE — Patient Instructions (Signed)
-   Start meloxicam 15 mg daily x2 weeks.  If still having pain after 2 weeks, complete 3rd-week of NSAID. May use remaining NSAID as needed once daily for pain control.  Do not to use additional over-the-counter NSAIDs (ibuprofen, naproxen, Advil, Aleve) while taking prescription NSAIDs.  May use Tylenol [phone] mg 2 to 3 times a day for breakthrough pain. No lifting more than 15 pounds with right arm  Shoulder HEP  4-6 week follow up

## 2023-04-18 MED ORDER — MELOXICAM 15 MG PO TABS: 15.0000 mg | ORAL_TABLET | Freq: Every day | ORAL | 0 refills | Status: DC

## 2023-04-18 NOTE — Addendum Note (Signed)
Addended by: Jerene Canny R on: 04/18/2023 11:10 AM   Modules accepted: Orders

## 2023-04-24 ENCOUNTER — Other Ambulatory Visit: Payer: Self-pay | Admitting: Obstetrics and Gynecology

## 2023-04-24 DIAGNOSIS — N63 Unspecified lump in unspecified breast: Secondary | ICD-10-CM

## 2023-05-09 ENCOUNTER — Other Ambulatory Visit: Payer: Self-pay | Admitting: Sports Medicine

## 2023-05-16 NOTE — Progress Notes (Unsigned)
    Abigail [name] D.[facility] [address] [place] [address] Phone: [phone]   Assessment and Plan:     There are no diagnoses linked to this encounter.  ***   Pertinent previous records reviewed include ***    Follow Up: ***     Subjective:   I, Abigail [name], am serving as a Neurosurgeon for Doctor Richardean Sale   Chief Complaint: arm pain    HPI:    04/17/2023 Patient is a 58 year old female with arm pain. Patient states she had a fall a month ago . Fooshed while walking her dog. Pain through her wrist, elbow , and shoulders . RICES. She had a hard time with fine motor skills. Tylenol and warm baths helped some. Decrease ROM. 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 She hit right side a little harder than left    05/19/2023 Patient states   Relevant Historical Information: Hypertension   Additional pertinent review of systems negative.   Current Outpatient Medications:    amoxicillin (AMOXIL) 500 MG capsule, take 4 tablets 1 hour prior to dental procedure, Disp: 4 capsule, Rfl: 0   Biotin 5 MG CAPS, Take 1 capsule by mouth daily. Patient takes 500 mcg, Disp: , Rfl:    ibuprofen (ADVIL) 200 MG tablet, Take 200 mg by mouth as needed., Disp: , Rfl:    ketoconazole (NIZORAL) 2 % shampoo, Apply topically 2 (two) times a week., Disp: 120 mL, Rfl: 2   Lysine 500 MG CAPS, Take 1 capsule by mouth daily., Disp: , Rfl:    meloxicam (MOBIC) 15 MG tablet, Take 1 tablet (15 mg total) by mouth daily., Disp: 30 tablet, Rfl: 0   meloxicam (MOBIC) 15 MG tablet, Take 1 tablet (15 mg total) by mouth daily., Disp: 30 tablet, Rfl: 0   Misc Natural Products (GLUCOSAMINE CHOND COMPLEX/MSM PO), Take by mouth., Disp: , Rfl:    Multiple Vitamin (MULTIVITAMIN) tablet, Take 1 tablet by mouth daily., Disp: , Rfl:    NON FORMULARY, Patient takes beet root powder, Disp: , Rfl:    omeprazole (PRILOSEC) 20 MG  capsule, Take 1 capsule (20 mg total) by mouth daily., Disp: 90 capsule, Rfl: 2   OVER THE COUNTER MEDICATION, Take 2 capsules by mouth at bedtime., Disp: , Rfl:    OVER THE COUNTER MEDICATION, Take 1 capsule by mouth at bedtime., Disp: , Rfl:    spironolactone (ALDACTONE) 25 MG tablet, TAKE 1 TABLET BY MOUTH EVERY DAY, Disp: 90 tablet, Rfl: 3   VITAMIN D PO, Take by mouth., Disp: , Rfl:    Objective:     There were no vitals filed for this visit.    There is no height or weight on file to calculate BMI.    Physical Exam:    ***   Electronically signed by:  Abigail [name] [facility] 7:27 AM 05/16/23

## 2023-05-19 ENCOUNTER — Ambulatory Visit: Payer: 59 | Admitting: Sports Medicine

## 2023-05-19 VITALS — HR 97 | Ht 64.0 in | Wt 294.0 lb

## 2023-05-19 DIAGNOSIS — W19XXXD Unspecified fall, subsequent encounter: Secondary | ICD-10-CM

## 2023-05-19 DIAGNOSIS — S52124D Nondisplaced fracture of head of right radius, subsequent encounter for closed fracture with routine healing: Secondary | ICD-10-CM

## 2023-05-19 NOTE — Patient Instructions (Signed)
No lifting more than 5-10 pound with right arm for the next 2-4 weeks  As needed follow up , if no improvement 2-4 week follow up

## 2023-05-23 ENCOUNTER — Encounter: Payer: Self-pay | Admitting: Family Medicine

## 2023-06-02 ENCOUNTER — Other Ambulatory Visit: Payer: 59

## 2023-06-19 ENCOUNTER — Encounter: Payer: Self-pay | Admitting: Internal Medicine

## 2023-06-19 ENCOUNTER — Ambulatory Visit: Payer: 59 | Admitting: Internal Medicine

## 2023-06-19 VITALS — BP 116/80 | HR 106 | Temp 98.4°F | Ht 64.0 in | Wt 303.6 lb

## 2023-06-19 DIAGNOSIS — R059 Cough, unspecified: Secondary | ICD-10-CM | POA: Diagnosis not present

## 2023-06-19 DIAGNOSIS — J069 Acute upper respiratory infection, unspecified: Secondary | ICD-10-CM | POA: Diagnosis not present

## 2023-06-19 LAB — POCT INFLUENZA A/B
Influenza A, POC: NEGATIVE
Influenza B, POC: NEGATIVE

## 2023-06-19 NOTE — Progress Notes (Signed)
 Established Patient Office Visit     CC/Reason for Visit: URI symptoms  HPI: Abigail [name] is a 58 y.o. female who is coming in today for the above mentioned reasons.  For the past 4 days has been having cough, headache, fatigue, myalgias, mild diarrhea, fever with a maximum temperature 100.7.  Her father recently passed away and his funeral was last weekend.  There was exposure to a sick family member.   Past Medical/Surgical History: Past Medical History:  Diagnosis Date   Asthma    Blood transfusion without reported diagnosis    Bursitis    GERD (gastroesophageal reflux disease)    Hemorrhoids    Osteoarthritis    Osteoporosis    Tinnitus    UTI (lower urinary tract infection)     Past Surgical History:  Procedure Laterality Date   CERVICAL POLYPECTOMY     TOTAL HIP ARTHROPLASTY  10/2008 & 06/2010   bilateral   TOTAL HIP ARTHROPLASTY     2012-left   WISDOM TOOTH EXTRACTION      Social History:  reports that she has never smoked. She has never used smokeless tobacco. She reports that she does not drink alcohol and does not use drugs.  Allergies: No Known Allergies  Family History:  Family History  Problem Relation Age of Onset   Hypertension Mother    Diabetes Mother    Bipolar disorder Mother    Prostate cancer Father    Arthritis Sister    Stroke Maternal Grandfather    Lung cancer Paternal Grandfather    Diabetes Brother    Colon cancer Neg Hx    Colon polyps Neg Hx    Esophageal cancer Neg Hx    Rectal cancer Neg Hx    Stomach cancer Neg Hx      Current Outpatient Medications:    amoxicillin (AMOXIL) 500 MG capsule, take 4 tablets 1 hour prior to dental procedure, Disp: 4 capsule, Rfl: 0   Biotin 5 MG CAPS, Take 1 capsule by mouth daily. Patient takes 500 mcg, Disp: , Rfl:    ibuprofen (ADVIL) 200 MG tablet, Take 200 mg by mouth as needed., Disp: , Rfl:    ketoconazole (NIZORAL) 2 % shampoo, Apply topically 2 (two) times a week., Disp: 120  mL, Rfl: 2   Lysine 500 MG CAPS, Take 1 capsule by mouth daily., Disp: , Rfl:    meloxicam (MOBIC) 15 MG tablet, Take 1 tablet (15 mg total) by mouth daily., Disp: 30 tablet, Rfl: 0   meloxicam (MOBIC) 15 MG tablet, Take 1 tablet (15 mg total) by mouth daily., Disp: 30 tablet, Rfl: 0   Misc Natural Products (GLUCOSAMINE CHOND COMPLEX/MSM PO), Take by mouth., Disp: , Rfl:    Multiple Vitamin (MULTIVITAMIN) tablet, Take 1 tablet by mouth daily., Disp: , Rfl:    NON FORMULARY, Patient takes beet root powder, Disp: , Rfl:    omeprazole (PRILOSEC) 20 MG capsule, Take 1 capsule (20 mg total) by mouth daily., Disp: 90 capsule, Rfl: 2   OVER THE COUNTER MEDICATION, Take 2 capsules by mouth at bedtime., Disp: , Rfl:    OVER THE COUNTER MEDICATION, Take 1 capsule by mouth at bedtime., Disp: , Rfl:    spironolactone (ALDACTONE) 25 MG tablet, TAKE 1 TABLET BY MOUTH EVERY DAY, Disp: 90 tablet, Rfl: 3   VITAMIN D PO, Take by mouth., Disp: , Rfl:   Review of Systems:  Negative unless indicated in HPI.   Physical Exam: Vitals:  06/19/23 1122  BP: 116/80  Pulse: (!) 106  Temp: 98.4 F (36.9 C)  TempSrc: Oral  SpO2: 94%  Weight: (!) 303 lb 9 oz (137.7 kg)  Height: 5\' 4"  (1.626 m)    Body mass index is 52.11 kg/m.   Physical Exam Vitals reviewed.  Constitutional:      Appearance: Normal appearance.  HENT:     Mouth/Throat:     Mouth: Mucous membranes are moist.     Pharynx: Oropharynx is clear.  Eyes:     Conjunctiva/sclera: Conjunctivae normal.  Cardiovascular:     Rate and Rhythm: Normal rate and regular rhythm.  Pulmonary:     Effort: Pulmonary effort is normal.     Breath sounds: Normal breath sounds.  Neurological:     Mental Status: She is alert.      Impression and Plan:  Viral URI with cough  Cough, unspecified type -     POCT Influenza A/B  -In office flu test was negative. -Given exam findings, PNA, pharyngitis, ear infection are not likely, hence abx have not  been prescribed. -Have advised rest, fluids, OTC antihistamines, cough suppressants and mucinex. -RTC if no improvement in 10-14 days.    Time spent:22 minutes reviewing chart, interviewing and examining patient and formulating plan of care.     Chaya Jan, MD [facility] at [facility]

## 2023-06-23 ENCOUNTER — Other Ambulatory Visit: Payer: 59

## 2023-07-11 ENCOUNTER — Ambulatory Visit: Payer: Self-pay | Admitting: Family Medicine

## 2023-07-11 MED ORDER — TIZANIDINE HCL 4 MG PO CAPS: ORAL_CAPSULE | ORAL | 0 refills | Status: AC

## 2023-07-11 MED ORDER — TIZANIDINE HCL 4 MG PO CAPS: ORAL_CAPSULE | ORAL | 0 refills | Status: DC

## 2023-07-11 NOTE — Telephone Encounter (Signed)
  Chief Complaint: back pain requesting medication  Symptoms: sudden left low back pain yesterday , numbness down left leg at times. Pain worse with standing , low pain level sitting. Comes and goes. 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 Only able to sleep on left side with pillow between legs. Took meloxicam from old Rx and noted minimal relief. Taking tylenol every 6 hours  Frequency: yesterday  Pertinent Negatives: Patient denies severe pain no fever no abdominal pain reported. No B/B issues. Can walk  Disposition: [] ED /[] Urgent Care (no appt availability in office) / [x] Appointment(In office/virtual)/ []  [facility]/ [] Home Care/ [] Refused Recommended Disposition /[] [place]/ []  Follow-up with PCP Additional Notes:     Appt scheduled for 07/14/23. Requesting zanaflex 4 mg or any other recommendations for pain level until  Monday .  Requesting a call back.       Copied from CRM [id number]. Topic: Clinical - Red Word Triage >> Jul 11, 2023 11:46 AM Fredrich Romans wrote: Red Word that prompted transfer to Nurse Triage: burning pain in left lower back Reason for Disposition  [1] MODERATE back pain (e.g., interferes with normal activities) AND [2] present > 3 days  Answer Assessment - Initial Assessment Questions 1. ONSET: "When did the pain begin?"      Yesterday  2. LOCATION: "Where does it hurt?" (upper, mid or lower back)     Left lower back  3. SEVERITY: "How bad is the pain?"  (e.g., Scale 1-10; mild, moderate, or severe)   - MILD (1-3): Doesn't interfere with normal activities.    - MODERATE (4-7): Interferes with normal activities or awakens from sleep.    - SEVERE (8-10): Excruciating pain, unable to do any normal activities.      Pain level standing more than 5 minutes 8/10. Sitting pain 1/10 . 4. PATTERN: "Is the pain constant?" (e.g., yes, no; constant, intermittent)      Comes and goes  5. RADIATION: "Does the pain shoot into your legs or somewhere else?"     No  shooting pain  6. CAUSE:  "What do you think is causing the back pain?"      Not sure  7. 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 MEDICINES: "What have you taken so far for the pain?" (e.g., nothing, acetaminophen, NSAIDS)     Meloxicam  9. NEUROLOGIC SYMPTOMS: "Do you have any weakness, numbness, or problems with bowel/bladder control?"     Numbness left leg comes and goes  10. OTHER SYMPTOMS: "Do you have any other symptoms?" (e.g., fever, abdomen pain, burning with urination, blood in urine)       Left low back pain burning , numbness left leg 11. PREGNANCY: "Is there any chance you are pregnant?" "When was your last menstrual period?"       na  Protocols used: Back Pain-A-AH

## 2023-07-11 NOTE — Telephone Encounter (Addendum)
 Rx sent

## 2023-07-14 ENCOUNTER — Ambulatory Visit (INDEPENDENT_AMBULATORY_CARE_PROVIDER_SITE_OTHER): Admitting: Family Medicine

## 2023-07-14 ENCOUNTER — Encounter: Payer: Self-pay | Admitting: Family Medicine

## 2023-07-14 VITALS — BP 132/90 | HR 70 | Temp 98.2°F | Wt 301.6 lb

## 2023-07-14 DIAGNOSIS — M5416 Radiculopathy, lumbar region: Secondary | ICD-10-CM | POA: Diagnosis not present

## 2023-07-14 MED ORDER — PREDNISONE 20 MG PO TABS: ORAL_TABLET | ORAL | 0 refills | Status: DC

## 2023-07-14 NOTE — Progress Notes (Signed)
 Established Patient Office Visit  Subjective   Patient ID: Abigail [name], female    DOB: 1966-03-23  Age: 58 y.o. MRN: [id number]  Chief Complaint  Patient presents with   Back Pain    Patient complains of back pain, x4 days    HPI   Abigail [name] is seen today with onset last week of some left lower lumbar back pain with radiation to left lower extremity.  Symptoms were worse on Thursday, Friday, and Saturday.  Denies any injury.  First noticed of symptoms when she was walking her dog on level ground.  She denies any prior history of back difficulties.  No urine or stool incontinence.  No fever.  No dysuria.  No focal weakness.  Did have some intermittent numbness involving left thigh but now resolved.  Pain may be just slightly improved.  She did start some meloxicam leftover from prior prescription 15 mg daily and also taking Zanaflex 4 mg nightly.  Past Medical History:  Diagnosis Date   Asthma    Blood transfusion without reported diagnosis    Bursitis    GERD (gastroesophageal reflux disease)    Hemorrhoids    Osteoarthritis    Osteoporosis    Tinnitus    UTI (lower urinary tract infection)    Past Surgical History:  Procedure Laterality Date   CERVICAL POLYPECTOMY     TOTAL HIP ARTHROPLASTY  10/2008 & 06/2010   bilateral   TOTAL HIP ARTHROPLASTY     2012-left   WISDOM TOOTH EXTRACTION      reports that she has never smoked. She has never used smokeless tobacco. She reports that she does not drink alcohol and does not use drugs. family history includes Arthritis in her sister; Bipolar disorder in her mother; Diabetes in her brother and mother; Hypertension in her mother; Lung cancer in her paternal grandfather; Prostate cancer in her father; Stroke in her maternal grandfather. No Known Allergies  Review of Systems  Constitutional:  Negative for chills, fever and weight loss.  Musculoskeletal:  Positive for back pain.  Neurological:  Negative for focal weakness.       Objective:     BP (!) 132/90 (BP Location: Left Arm, Patient Position: Sitting, Cuff Size: Small)   Pulse 70   Temp 98.2 F (36.8 C) (Oral)   Wt (!) 301 lb 9.6 oz (136.8 kg)   SpO2 97%   BMI 51.77 kg/m  BP Readings from Last 3 Encounters:  07/14/23 (!) 132/90  06/19/23 116/80  07/30/22 (!) 140/86   Wt Readings from Last 3 Encounters:  07/14/23 (!) 301 lb 9.6 oz (136.8 kg)  06/19/23 (!) 303 lb 9 oz (137.7 kg)  05/19/23 294 lb (133.4 kg)      Physical Exam Vitals reviewed.  Constitutional:      General: She is not in acute distress.    Appearance: She is not ill-appearing.  Cardiovascular:     Rate and Rhythm: Normal rate and regular rhythm.  Musculoskeletal:     Comments: Straight leg raises are negative bilaterally  Neurological:     Mental Status: She is alert.     Comments: Full strength lower extremities with plantarflexion, dorsiflexion, knee extension.  1+ reflexes ankle bilaterally.  Difficult to elicit knee bilaterally.      No results found for any visits on 07/14/23.    The 10-year ASCVD risk score (Arnett DK, et al., 2019) is: 3.4%    Assessment & Plan:   Left sided sciatica symptoms for the  past few days.  Nonfocal neuroexam.  Symptoms slightly improved since starting meloxicam.  We discussed prednisone 20 mg 2 tablets daily for 6 days.  Continue Zanaflex at night as needed.  Follow-up for any persistent or worsening symptoms  Evelena Peat, MD

## 2023-07-21 ENCOUNTER — Other Ambulatory Visit: Payer: 59

## 2023-07-29 ENCOUNTER — Encounter: Payer: Self-pay | Admitting: Gastroenterology

## 2023-08-04 ENCOUNTER — Encounter: Payer: Self-pay | Admitting: Family Medicine

## 2023-08-04 ENCOUNTER — Ambulatory Visit (INDEPENDENT_AMBULATORY_CARE_PROVIDER_SITE_OTHER): Admitting: Family Medicine

## 2023-08-04 ENCOUNTER — Other Ambulatory Visit

## 2023-08-04 VITALS — BP 132/92 | HR 98 | Temp 98.3°F | Ht 65.75 in | Wt 305.2 lb

## 2023-08-04 DIAGNOSIS — R635 Abnormal weight gain: Secondary | ICD-10-CM

## 2023-08-04 DIAGNOSIS — Z0000 Encounter for general adult medical examination without abnormal findings: Secondary | ICD-10-CM

## 2023-08-04 LAB — CBC WITH DIFFERENTIAL/PLATELET
Basophils Absolute: 0.1 K/uL (ref 0.0–0.1)
Basophils Relative: 0.9 % (ref 0.0–3.0)
Eosinophils Absolute: 0.3 K/uL (ref 0.0–0.7)
Eosinophils Relative: 4.1 % (ref 0.0–5.0)
HCT: 43 % (ref 36.0–46.0)
Hemoglobin: 14.1 g/dL (ref 12.0–15.0)
Lymphocytes Relative: 26.4 % (ref 12.0–46.0)
Lymphs Abs: 2 K/uL (ref 0.7–4.0)
MCHC: 32.7 g/dL (ref 30.0–36.0)
MCV: 89.9 fl (ref 78.0–100.0)
Monocytes Absolute: 0.7 K/uL (ref 0.1–1.0)
Monocytes Relative: 9 % (ref 3.0–12.0)
Neutro Abs: 4.4 K/uL (ref 1.4–7.7)
Neutrophils Relative %: 59.6 % (ref 43.0–77.0)
Platelets: 325 K/uL (ref 150.0–400.0)
RBC: 4.79 Mil/uL (ref 3.87–5.11)
RDW: 13.8 % (ref 11.5–15.5)
WBC: 7.4 K/uL (ref 4.0–10.5)

## 2023-08-04 LAB — HEPATIC FUNCTION PANEL
ALT: 18 U/L (ref 0–35)
AST: 17 U/L (ref 0–37)
Albumin: 4.4 g/dL (ref 3.5–5.2)
Alkaline Phosphatase: 64 U/L (ref 39–117)
Bilirubin, Direct: 0.1 mg/dL (ref 0.0–0.3)
Total Bilirubin: 0.5 mg/dL (ref 0.2–1.2)
Total Protein: 7.4 g/dL (ref 6.0–8.3)

## 2023-08-04 LAB — BASIC METABOLIC PANEL WITH GFR
BUN: 24 mg/dL — ABNORMAL HIGH (ref 6–23)
CO2: 27 meq/L (ref 19–32)
Calcium: 9.4 mg/dL (ref 8.4–10.5)
Chloride: 104 meq/L (ref 96–112)
Creatinine, Ser: 0.66 mg/dL (ref 0.40–1.20)
GFR: 98.68 mL/min
Glucose, Bld: 106 mg/dL — ABNORMAL HIGH (ref 70–99)
Potassium: 4.6 meq/L (ref 3.5–5.1)
Sodium: 139 meq/L (ref 135–145)

## 2023-08-04 LAB — TSH: TSH: 3.98 u[IU]/mL (ref 0.35–5.50)

## 2023-08-04 LAB — LIPID PANEL
Cholesterol: 212 mg/dL — ABNORMAL HIGH (ref 0–200)
HDL: 47.1 mg/dL
LDL Cholesterol: 134 mg/dL — ABNORMAL HIGH (ref 0–99)
NonHDL: 164.63
Total CHOL/HDL Ratio: 4
Triglycerides: 154 mg/dL — ABNORMAL HIGH (ref 0.0–149.0)
VLDL: 30.8 mg/dL (ref 0.0–40.0)

## 2023-08-04 LAB — HEMOGLOBIN A1C: Hgb A1c MFr Bld: 6.5 % (ref 4.6–6.5)

## 2023-08-04 NOTE — Patient Instructions (Signed)
 Consider pneumonia vaccine at some point this year.

## 2023-08-04 NOTE — Progress Notes (Signed)
 Established Patient Office Visit  Subjective   Patient ID: Abigail [name], female    DOB: 1966-02-13  Age: 58 y.o. MRN: [id number]  Chief Complaint  Patient presents with   Annual Exam    HPI   Abigail [name] is seen for physical exam.  She and her husband have been very busy caring for her mother-in-law-who now lives with them.  She is basically housebound.  They are providing 24/7 coverage.  Abigail [name] has a longstanding history of obesity.  She has unfortunately gained about 35 pounds since last year at this time.  She attributes a lot of this to stress and emotional eating.  Has been eating some fast food.  She states husband has to bring in several high-calorie foods especially for his mother.    Abigail [name] does have history of prediabetes range blood sugars and strong family history of type 2 diabetes as below  Health maintenance reviewed:  Health Maintenance  Topic Date Due   HIV Screening  Never done   Cervical Cancer Screening (HPV/Pap Cotest)  02/28/2013   Colonoscopy  01/17/2022   COVID-19 Vaccine (3 - 2024-25 season) 12/29/2022   Pneumococcal Vaccine 6-41 Years old (1 of 2 - PCV) 08/03/2024 (Originally 01/22/1972)   INFLUENZA VACCINE  11/28/2023   MAMMOGRAM  04/08/2024   DTaP/Tdap/Td (3 - Td or Tdap) 06/21/2027   Hepatitis C Screening  Completed   Zoster Vaccines- Shingrix  Completed   HPV VACCINES  Aged Out   - She is in process of getting colonoscopy June 2 - No pneumonia vaccination.  She declines at this time but will consider - Stressed importance of annual flu vaccine - She sees gynecologist for mammograms and Pap smears and states her Pap smear is up-to-date..  Social history-married.  No children.  Non-smoker.  No alcohol.  Currently helping to care for her mother-in-law who lives with him  Family history-mother died age 29 complications of type 2 diabetes.  Mother also had history of hypertension and bipolar disorder.  Father passed away at age 44 complications  from failure to thrive.  He also had history of prostate cancer.  She has a brother with type 2 diabetes.  Past Medical History:  Diagnosis Date   Asthma    Blood transfusion without reported diagnosis    Bursitis    GERD (gastroesophageal reflux disease)    Hemorrhoids    Osteoarthritis    Osteoporosis    Tinnitus    UTI (lower urinary tract infection)    Past Surgical History:  Procedure Laterality Date   CERVICAL POLYPECTOMY     TOTAL HIP ARTHROPLASTY  10/2008 & 06/2010   bilateral   TOTAL HIP ARTHROPLASTY     2012-left   WISDOM TOOTH EXTRACTION      reports that she has never smoked. She has never used smokeless tobacco. She reports that she does not drink alcohol and does not use drugs. family history includes Arthritis in her sister; Bipolar disorder in her mother; Diabetes in her brother and mother; Hypertension in her mother; Lung cancer in her paternal grandfather; Prostate cancer in her father; Stroke in her maternal grandfather. No Known Allergies   Review of Systems  Constitutional:  Negative for chills, fever, malaise/fatigue and weight loss.  HENT:  Negative for hearing loss.   Eyes:  Negative for blurred vision and double vision.  Respiratory:  Negative for cough and shortness of breath.   Cardiovascular:  Negative for chest pain, palpitations and leg swelling.  Gastrointestinal:  Negative  for abdominal pain, blood in stool, constipation and diarrhea.  Genitourinary:  Negative for dysuria.  Skin:  Negative for rash.  Neurological:  Negative for dizziness, speech change, seizures, loss of consciousness and headaches.  Psychiatric/Behavioral:  Negative for depression.       Objective:     BP (!) 132/92 (BP Location: Left Arm, Patient Position: Sitting, Cuff Size: Large)   Pulse 98   Temp 98.3 F (36.8 C) (Oral)   Ht 5' 5.75" (1.67 m)   Wt (!) 305 lb 3.2 oz (138.4 kg)   SpO2 97%   BMI 49.64 kg/m  BP Readings from Last 3 Encounters:  08/04/23 (!) 132/92   07/14/23 (!) 132/90  06/19/23 116/80   Wt Readings from Last 3 Encounters:  08/04/23 (!) 305 lb 3.2 oz (138.4 kg)  07/14/23 (!) 301 lb 9.6 oz (136.8 kg)  06/19/23 (!) 303 lb 9 oz (137.7 kg)      Physical Exam Vitals reviewed.  Constitutional:      General: She is not in acute distress.    Appearance: She is well-developed. She is not ill-appearing.  HENT:     Head: Normocephalic and atraumatic.  Eyes:     Pupils: Pupils are equal, round, and reactive to light.  Neck:     Thyroid: No thyromegaly.  Cardiovascular:     Rate and Rhythm: Normal rate and regular rhythm.     Heart sounds: Normal heart sounds. No murmur heard. Pulmonary:     Effort: No respiratory distress.     Breath sounds: Normal breath sounds. No wheezing or rales.  Abdominal:     General: Bowel sounds are normal. There is no distension.     Palpations: Abdomen is soft. There is no mass.     Tenderness: There is no abdominal tenderness. There is no guarding or rebound.  Musculoskeletal:     Cervical back: Normal range of motion and neck supple.     Comments: Does have trace edema feet bilaterally.  No pitting edema of the legs.  Lymphadenopathy:     Cervical: No cervical adenopathy.  Skin:    Findings: No rash.  Neurological:     Mental Status: She is alert and oriented to person, place, and time.     Cranial Nerves: No cranial nerve deficit.  Psychiatric:        Behavior: Behavior normal.        Thought Content: Thought content normal.        Judgment: Judgment normal.      No results found for any visits on 08/04/23.    The 10-year ASCVD risk score (Arnett DK, et al., 2019) is: 3.4%    Assessment & Plan:   Problem List Items Addressed This Visit   None Visit Diagnoses       Physical exam    -  Primary   Relevant Orders   Basic metabolic panel with GFR   Lipid panel   CBC with Differential/Platelet   Hepatic function panel     Weight gain       Relevant Orders   TSH   Hemoglobin  A79c     58 year old female here for physical exam.  She has morbid obesity with BMI over 49.  We spent quite some time discussing importance of weight loss and different strategies.  She has previously had success with calorie counting.  She attributes a lot of her weight to stress and emotional eating.  She specifically has interest in GLP-1 medications.  Will obtain lab work as above first.  Possibly initiate GLP-1 depending on results above.  She does not have any contraindications such as pancreatitis or family history of medullary thyroid cancer or multiple endocrine neoplasia  -Discussed pneumonia vaccine and she declines but will consider at some point this year - Recommend annual flu vaccine - She plans to continue GYN follow-up for Pap smears and mammograms - She is in process of getting colonoscopy for June 2  No follow-ups on file.    Abigail Peat, MD

## 2023-08-05 ENCOUNTER — Other Ambulatory Visit: Payer: Self-pay

## 2023-08-05 MED ORDER — TIRZEPATIDE 2.5 MG/0.5ML ~~LOC~~ SOAJ: 2.5000 mg | SUBCUTANEOUS | 1 refills | Status: DC

## 2023-08-06 ENCOUNTER — Other Ambulatory Visit (HOSPITAL_COMMUNITY): Payer: Self-pay

## 2023-08-06 ENCOUNTER — Telehealth: Payer: Self-pay

## 2023-08-06 NOTE — Telephone Encounter (Signed)
 Copied from CRM [id number]. Topic: Clinical - Prescription Issue >> Aug 06, 2023  3:25 PM Elizebeth Brooking wrote: Reason for CRM: [facility] called regarding prior authorization for  tirzepatide [facility]) 2.5 MG/0.5ML Pen  [id number] - fax number [phone]  attn: prior auth management - mark it urgent response within 72 hours

## 2023-08-06 NOTE — Telephone Encounter (Signed)
 PA request has been Submitted. New Encounter has been or will be created for follow up. For additional info see Pharmacy Prior Auth telephone encounter from 08/06/23.

## 2023-08-06 NOTE — Telephone Encounter (Signed)
 Pharmacy Patient Advocate Encounter   Received notification from Pt Calls Messages that prior authorization for Mounjaro 2.5MG /0.5ML auto-injectors is required/requested.   Insurance verification completed.   The patient is insured through [facility] .   Per test claim: PA required; PA submitted to above mentioned insurance via CoverMyMeds Key/confirmation #/EOC [id number] Status is pending

## 2023-08-07 ENCOUNTER — Other Ambulatory Visit (HOSPITAL_COMMUNITY): Payer: Self-pay

## 2023-08-07 ENCOUNTER — Telehealth: Payer: Self-pay | Admitting: *Deleted

## 2023-08-07 ENCOUNTER — Telehealth: Payer: Self-pay

## 2023-08-07 MED ORDER — TIRZEPATIDE 2.5 MG/0.5ML ~~LOC~~ SOAJ: 2.5000 mg | SUBCUTANEOUS | 1 refills | Status: DC

## 2023-08-07 NOTE — Telephone Encounter (Signed)
 Pharmacy notified.

## 2023-08-07 NOTE — Telephone Encounter (Signed)
 Copied from CRM [id number]. Topic: Clinical - Prescription Issue >> Aug 06, 2023  3:25 PM Elizebeth Brooking wrote: Reason for CRM: [facility] called regarding prior authorization for  tirzepatide [facility]) 2.5 MG/0.5ML Pen  [id number] - fax number [phone]  attn: prior auth management - mark it urgent response within 72 hours >> Aug 07, 2023  9:07 AM Almira Coaster wrote: Kathlene November from [employer] is calling to advise that the [facility] 2.5MG /0.5ML auto-injectors was approved yesterday, auth number [id number]. Start date 08/06/2023-08/05/2024. They will also be faxing the auth to the office.

## 2023-08-07 NOTE — Telephone Encounter (Signed)
 Rx sent

## 2023-08-07 NOTE — Telephone Encounter (Signed)
 Copied from CRM [id number]. Topic: Clinical - Medication Question >> Aug 07, 2023  3:38 PM Fredrich Romans wrote: Reason for CRM: Patient would like to have prescription for  tirzepatide [facility]) 2.5 MG/0.5ML Pen   sent to    [id number] Ginette Otto,  - [address] AT [address] [facility] OF [address] & [address]  Phone: [phone] Fax: [phone]

## 2023-08-07 NOTE — Telephone Encounter (Signed)
 Pharmacy Patient Advocate Encounter  Received notification from [facility] that Prior Authorization for [facility] 2.5MG /0.5ML auto-injectors  has been APPROVED from 08/06/23 to 08/05/24. Ran test claim, Copay is $25. This test claim was processed through [facility]- copay amounts may vary at other pharmacies due to pharmacy/plan contracts, or as the patient moves through the different stages of their insurance plan.   PA #/Case ID/Reference #: [id number]

## 2023-08-07 NOTE — Telephone Encounter (Signed)
 PA request has been Approved. New Encounter has been or will be created for follow up. For additional info see Pharmacy Prior Auth telephone encounter from 08/06/23.

## 2023-08-12 ENCOUNTER — Telehealth: Payer: Self-pay | Admitting: Gastroenterology

## 2023-08-12 NOTE — Telephone Encounter (Signed)
 The pt will keep her appt with Deanna in June and discuss at that time when she should have colon in regards to timing of a D and C she is having.

## 2023-08-12 NOTE — Telephone Encounter (Signed)
 Patient called stated she will be possibly having a colonoscopy done in June and also has another procedure within the same month she would like to know if that would be doable. Please advise.

## 2023-08-26 IMAGING — MG MM DIGITAL DIAGNOSTIC UNILAT*R* W/ TOMO W/ CAD
4 series · 4 of 12 positions shown · non-contrast
Comparison: Previous exam(s).

CLINICAL DATA: Short-term interval follow-up right mammogram and
bilateral breast ultrasound for 2 areas of probable apocrine
metaplasia in the right breast and 1 area of apocrine metaplasia in
the left breast.

EXAM:
DIGITAL DIAGNOSTIC UNILATERAL RIGHT MAMMOGRAM WITH TOMOSYNTHESIS AND
CAD; ULTRASOUND RIGHT BREAST LIMITED; ULTRASOUND LEFT BREAST LIMITED
TECHNIQUE: Right digital diagnostic mammography and breast tomosynthesis was
performed. The images were evaluated with computer-aided detection.;
Targeted ultrasound examination of the right breast was performed;
Targeted ultrasound examination of the left breast was performed.

[R MLO synth-2D]
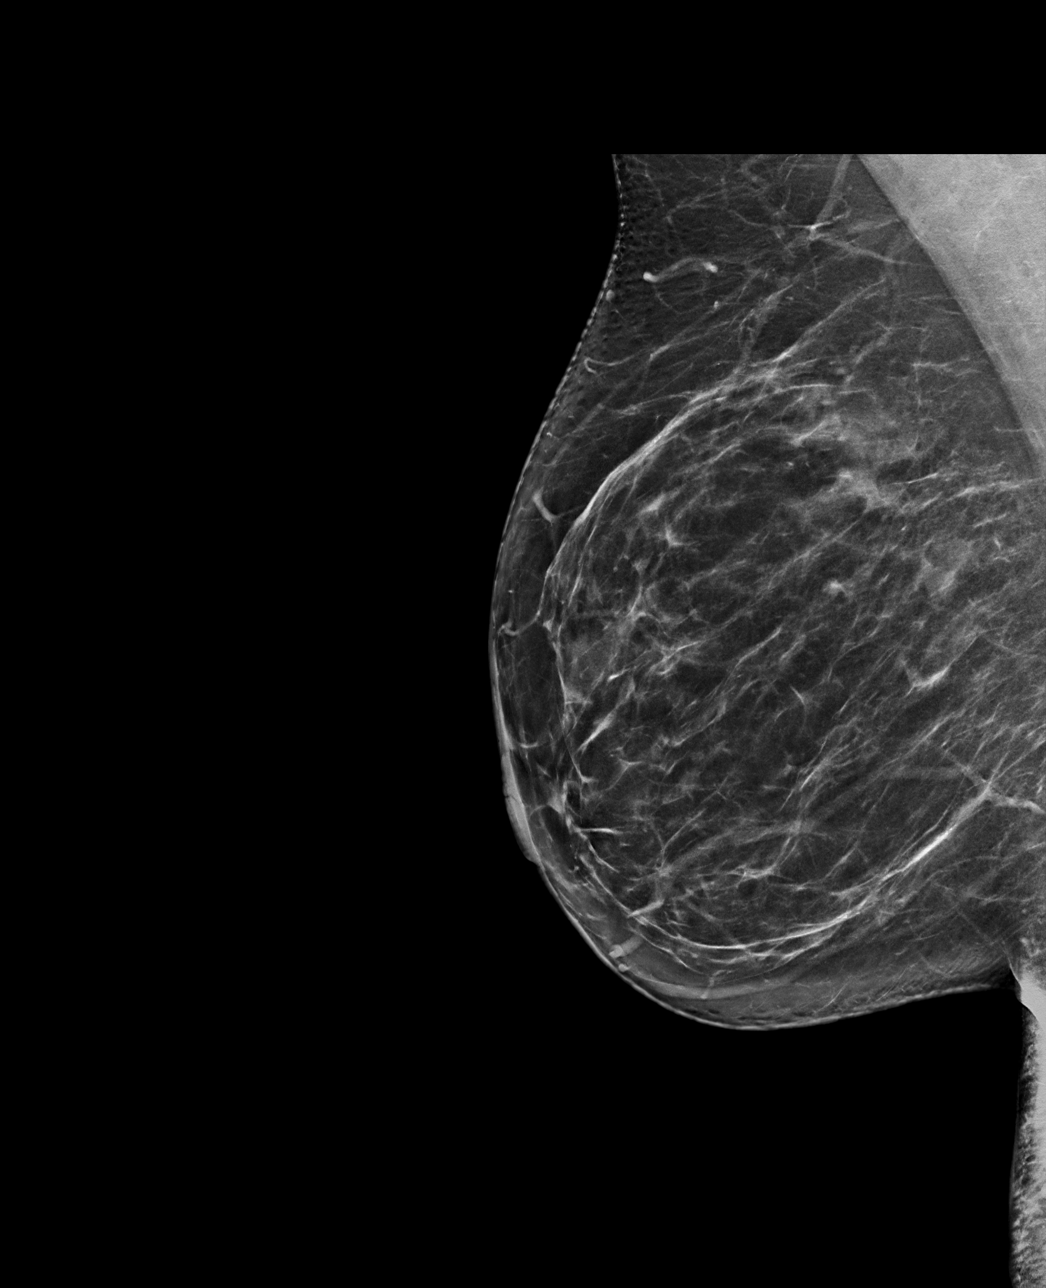

[R CC synth-2D]
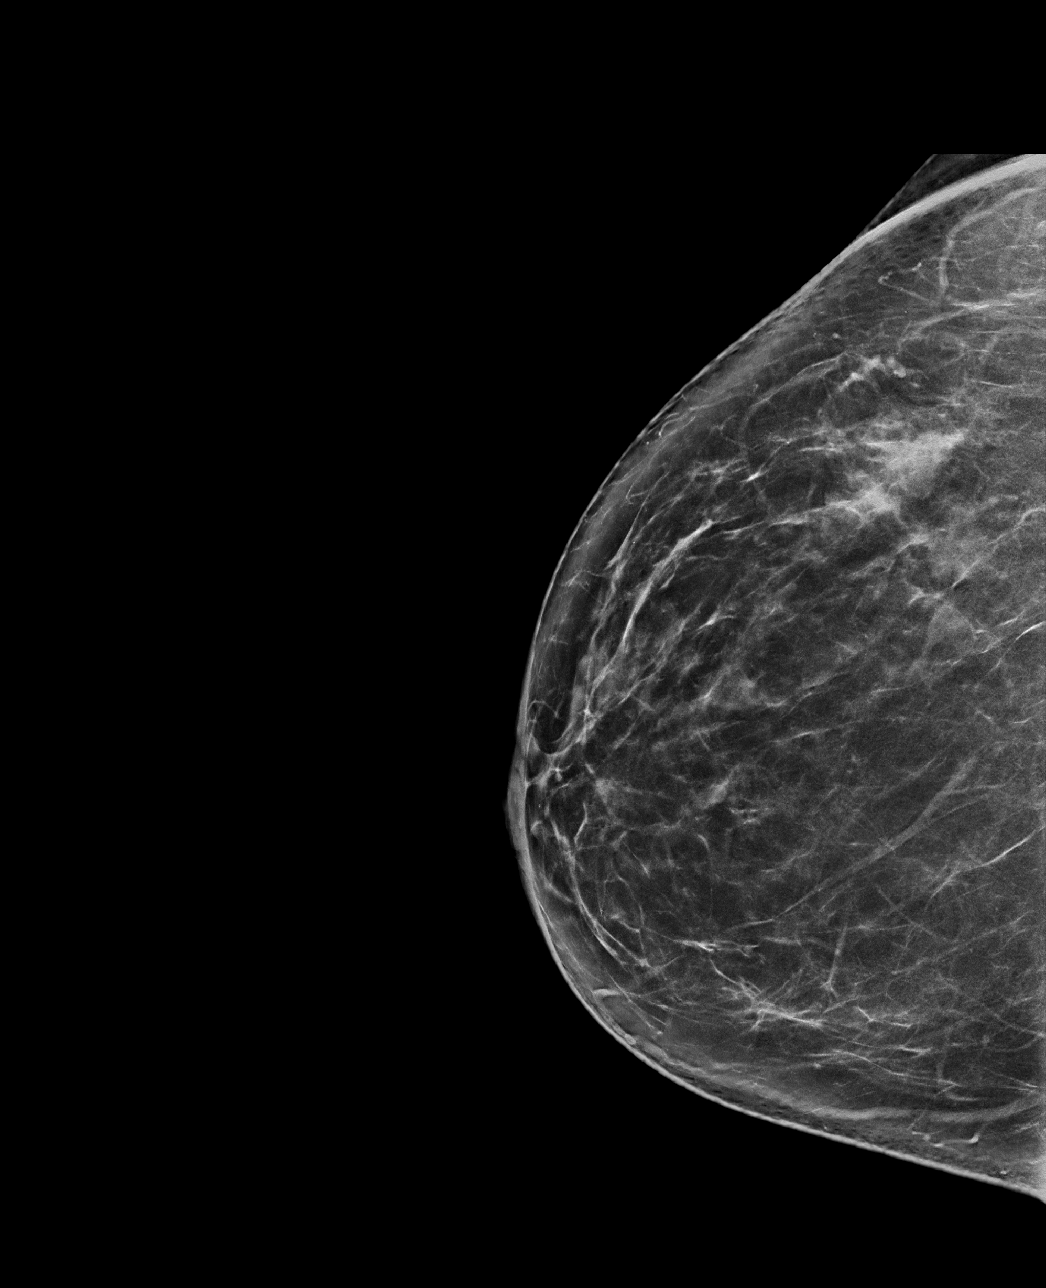

[R CC tomo · tomo slice 43/84.0]
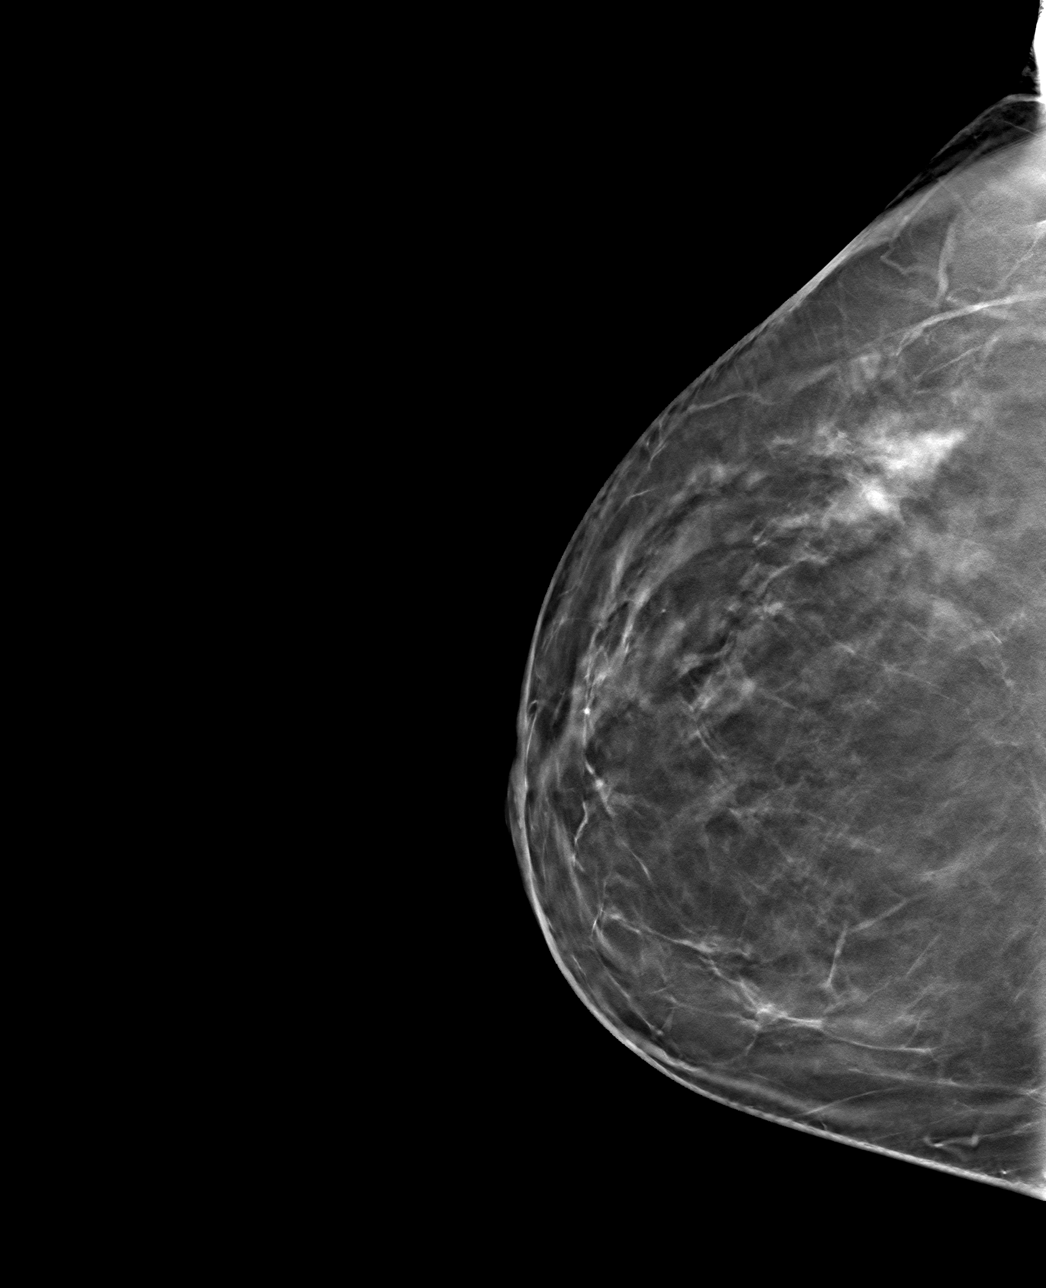

[R MLO tomo · tomo slice 45/89.0]
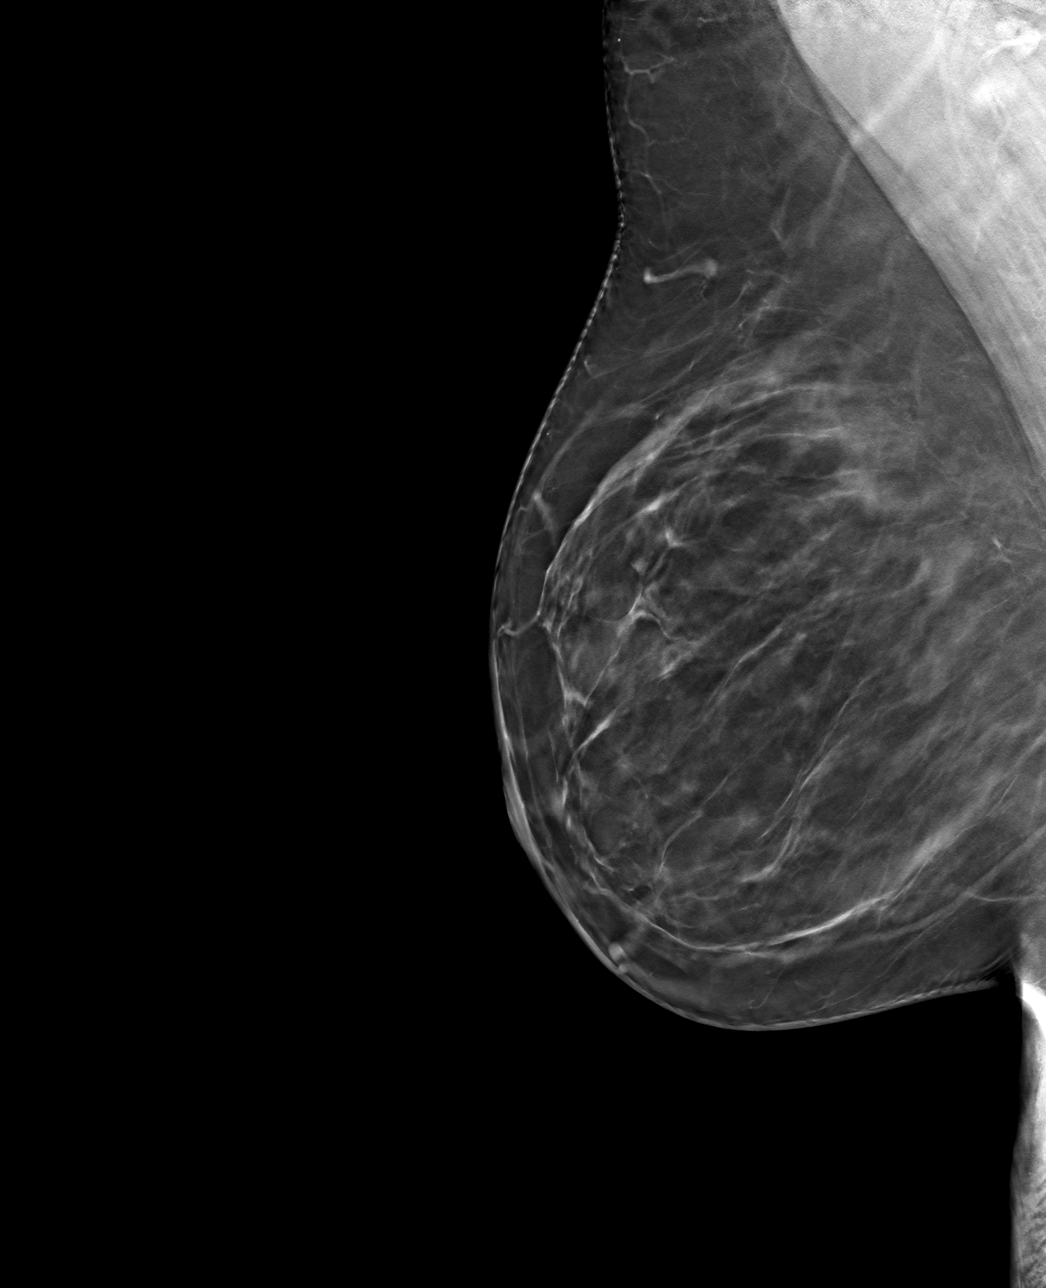

[4 of 12 positions shown; findings below may reference images not displayed]

ACR Breast Density Category b: There are scattered areas of
fibroglandular density.
FINDINGS: Mammographic appearance of the right breast is stable. No suspicious
mass or malignant type microcalcifications identified.

Targeted ultrasound is performed, showing probable benign apocrine
metaplasia in the right breast at 5 o'clock 4 cm from the nipple
measuring 5 x 2 x 6 mm. On the prior ultrasound dated 03/13/2021 it
measured 9 x 3 x 9 mm. Probable benign apocrine metaplasia in the
right breast at [DATE] 2 cm from the nipple measures 6 x 3 x 5 mm. On
the prior ultrasound dated 03/13/2021 it measured 6 x 4 x 6 mm.

Sonographic evaluation of the left breast shows probable apocrine
metaplasia measuring 8 x 4 x 6 mm. On the prior ultrasound dated
03/13/2021 it measured 11 x 4 x 8 mm.
IMPRESSION: The 2 areas of apocrine metaplasia in the right breast and 1 area in
the left breast are smaller than the prior exam and likely benign.

RECOMMENDATION:
Bilateral diagnostic mammogram and ultrasound in 6 months is
recommended.

I have discussed the findings and recommendations with the patient.
If applicable, a reminder letter will be sent to the patient
regarding the next appointment.

BI-RADS CATEGORY  3: Probably benign.

## 2023-08-26 IMAGING — US US BREAST*R* LIMITED INC AXILLA
1 series · 9 of 9 positions shown · non-contrast
Comparison: Previous exam(s).

CLINICAL DATA: Short-term interval follow-up right mammogram and
bilateral breast ultrasound for 2 areas of probable apocrine
metaplasia in the right breast and 1 area of apocrine metaplasia in
the left breast.

EXAM:
DIGITAL DIAGNOSTIC UNILATERAL RIGHT MAMMOGRAM WITH TOMOSYNTHESIS AND
CAD; ULTRASOUND RIGHT BREAST LIMITED; ULTRASOUND LEFT BREAST LIMITED
TECHNIQUE: Right digital diagnostic mammography and breast tomosynthesis was
performed. The images were evaluated with computer-aided detection.;
Targeted ultrasound examination of the right breast was performed;
Targeted ultrasound examination of the left breast was performed.

[Series 1: us breast*right* limited inc axilla · 0.06mm/px · 9 of 9 slices shown]
[im 1/9]
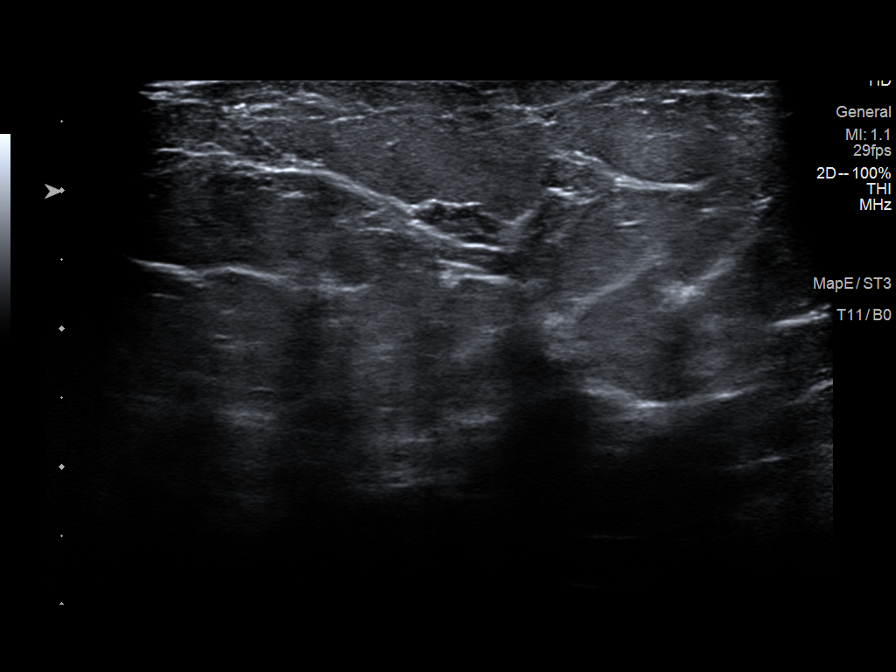
[im 2/9]
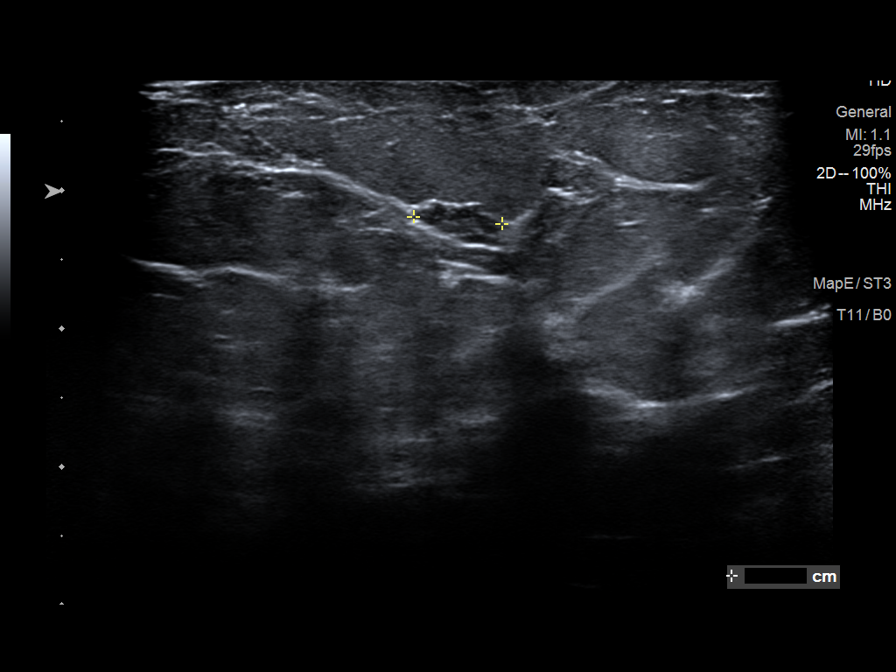
[im 3/9]
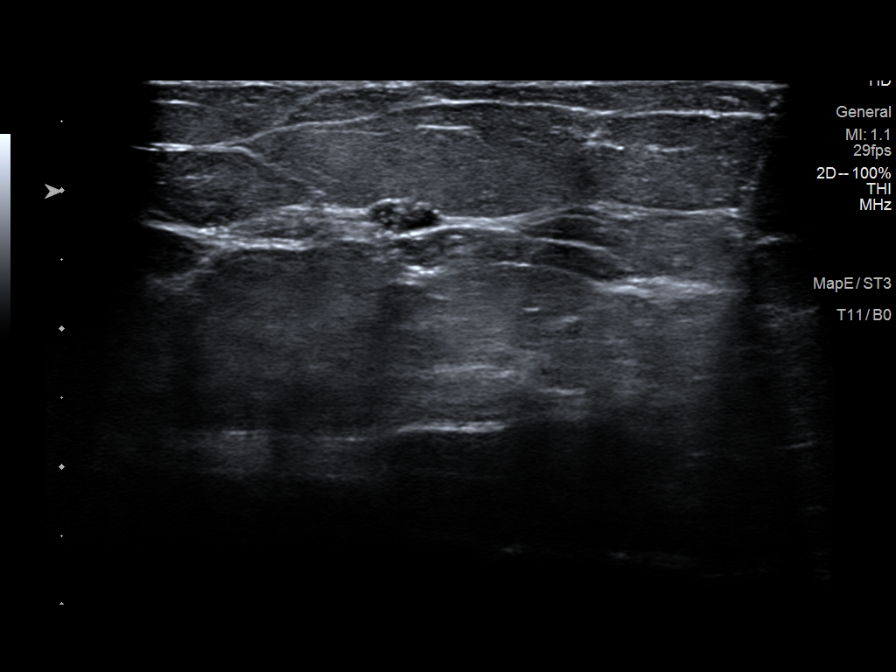
[im 4/9]
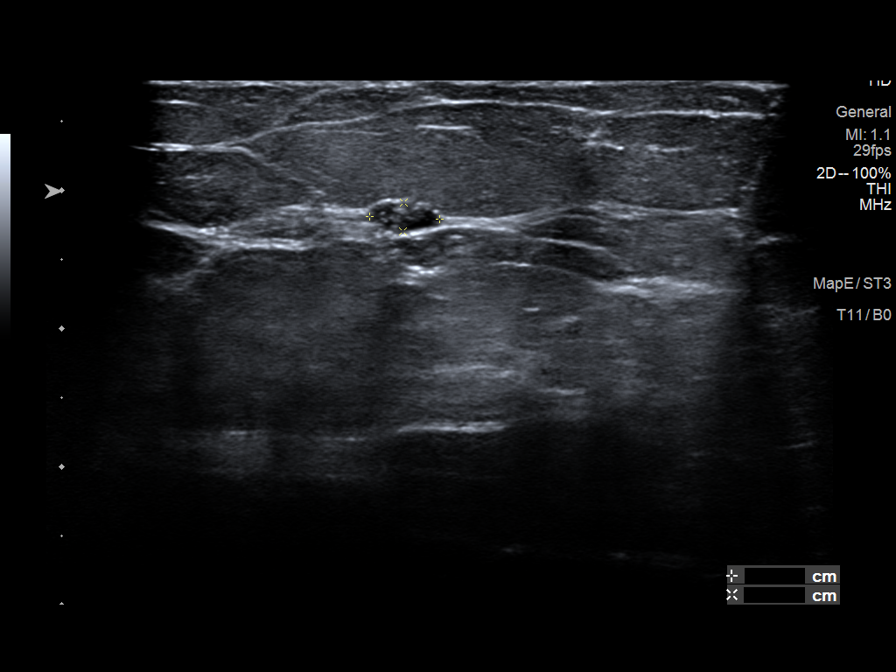
[im 5/9]
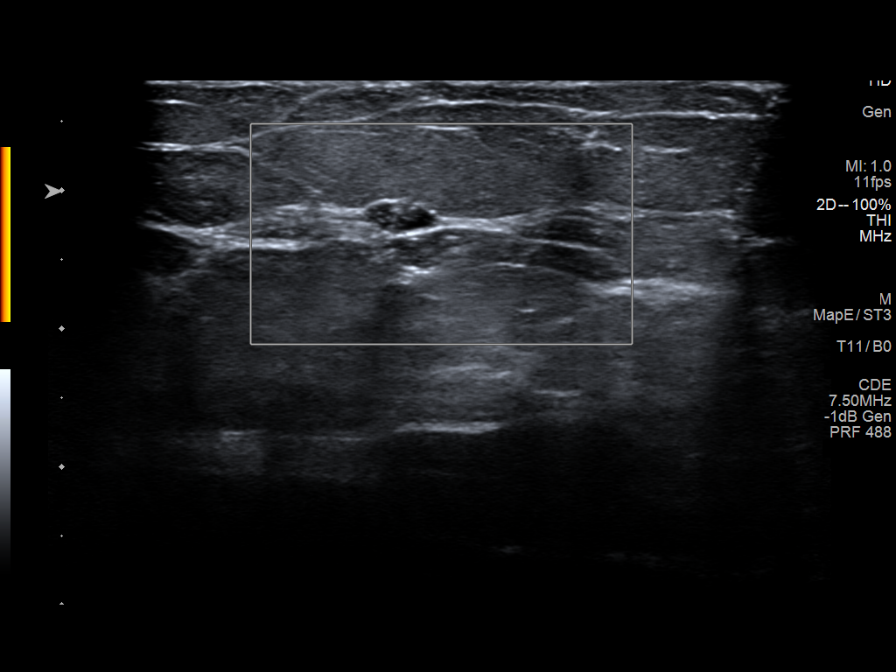
[im 6/9]
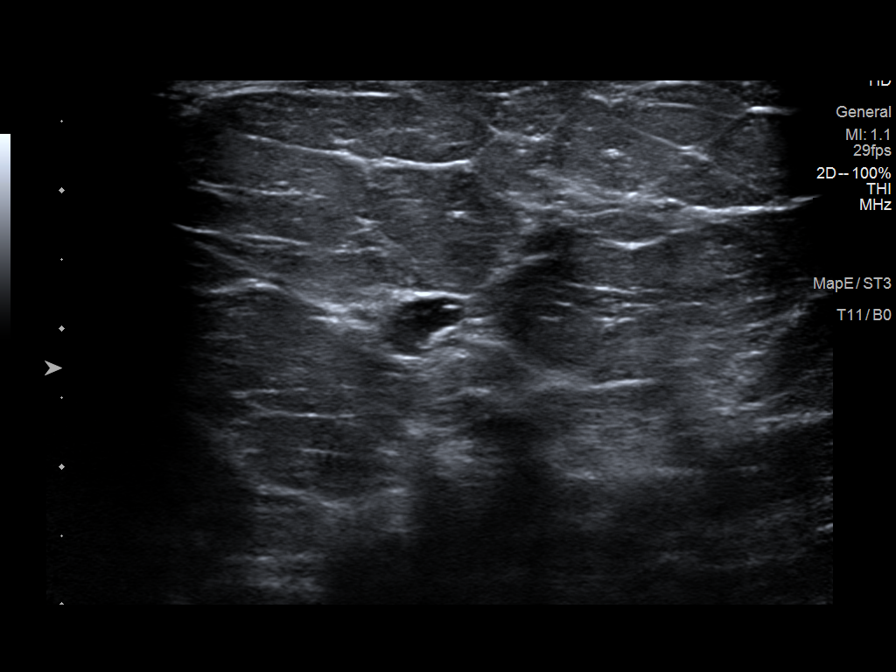
[im 7/9]
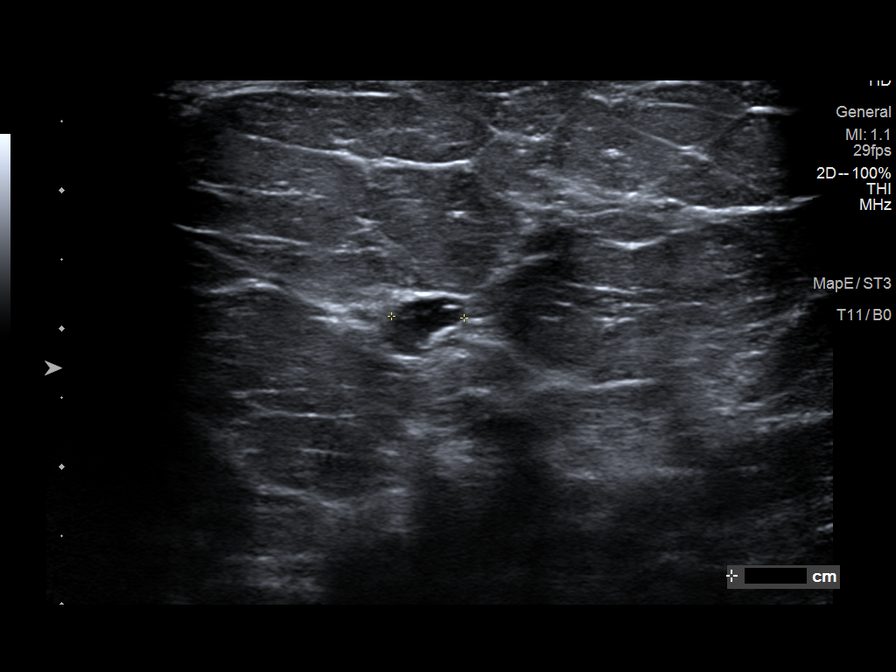
[im 8/9]
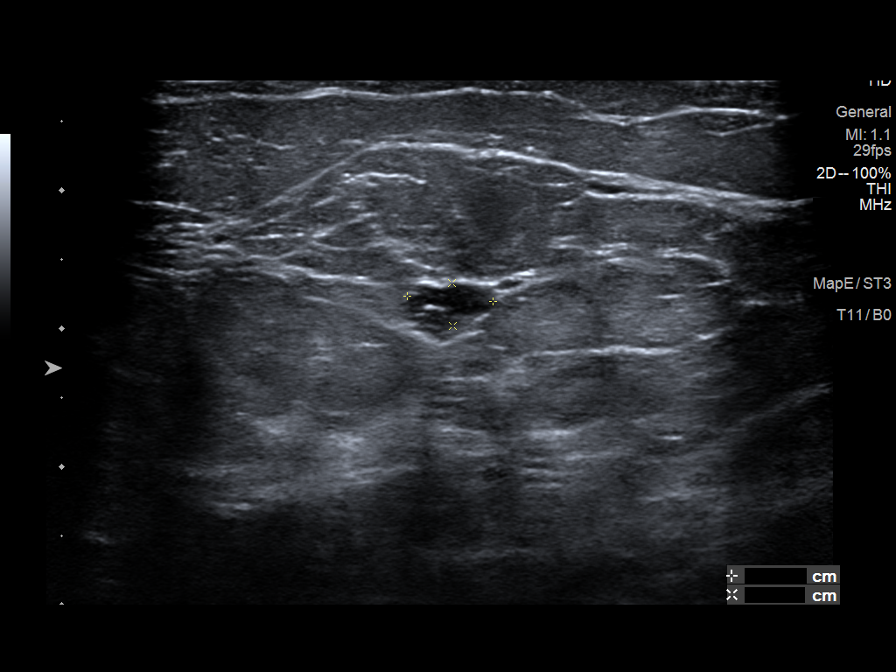
[im 9/9]
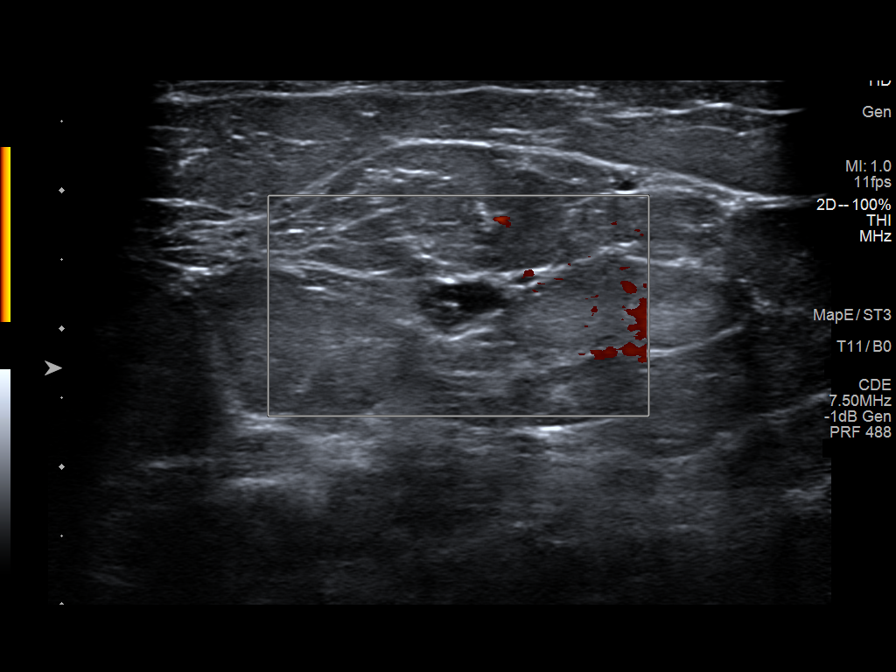

[9 of 9 positions shown; findings below may reference images not displayed]

ACR Breast Density Category b: There are scattered areas of
fibroglandular density.
FINDINGS: Mammographic appearance of the right breast is stable. No suspicious
mass or malignant type microcalcifications identified.

Targeted ultrasound is performed, showing probable benign apocrine
metaplasia in the right breast at 5 o'clock 4 cm from the nipple
measuring 5 x 2 x 6 mm. On the prior ultrasound dated 03/13/2021 it
measured 9 x 3 x 9 mm. Probable benign apocrine metaplasia in the
right breast at [DATE] 2 cm from the nipple measures 6 x 3 x 5 mm. On
the prior ultrasound dated 03/13/2021 it measured 6 x 4 x 6 mm.

Sonographic evaluation of the left breast shows probable apocrine
metaplasia measuring 8 x 4 x 6 mm. On the prior ultrasound dated
03/13/2021 it measured 11 x 4 x 8 mm.
IMPRESSION: The 2 areas of apocrine metaplasia in the right breast and 1 area in
the left breast are smaller than the prior exam and likely benign.

RECOMMENDATION:
Bilateral diagnostic mammogram and ultrasound in 6 months is
recommended.

I have discussed the findings and recommendations with the patient.
If applicable, a reminder letter will be sent to the patient
regarding the next appointment.

BI-RADS CATEGORY  3: Probably benign.

## 2023-09-01 ENCOUNTER — Encounter: Payer: Self-pay | Admitting: Family Medicine

## 2023-09-05 ENCOUNTER — Other Ambulatory Visit: Payer: Self-pay | Admitting: Family Medicine

## 2023-09-08 MED ORDER — TIRZEPATIDE 5 MG/0.5ML ~~LOC~~ SOAJ: 5.0000 mg | SUBCUTANEOUS | 0 refills | Status: DC

## 2023-09-09 ENCOUNTER — Telehealth: Payer: Self-pay

## 2023-09-09 MED ORDER — TIRZEPATIDE 5 MG/0.5ML ~~LOC~~ SOAJ: 5.0000 mg | SUBCUTANEOUS | 0 refills | Status: DC

## 2023-09-09 NOTE — Telephone Encounter (Signed)
 Rx sent.

## 2023-09-09 NOTE — Addendum Note (Signed)
 Addended by: Aurelio Leer on: 09/09/2023 02:51 PM   Modules accepted: Orders

## 2023-09-09 NOTE — Telephone Encounter (Signed)
 Please see patient request to increase dosage of Mounjaro - patient states pharmacy is out of stock and feels she can tolerate increased dosage.   Copied from CRM [id number]. Topic: Clinical - Medication Question >> Sep 09, 2023  1:41 PM Abigail [name] wrote: Reason for CRM: Att: Mykal- Patient states that her pharmacy is out of stock of the Mounjaro 2.5 and wants to know if she can be increased to the 5mg  since she is tolerating the medication well. Patient request a callback at [phone]

## 2023-09-29 ENCOUNTER — Ambulatory Visit: Admitting: Gastroenterology

## 2023-09-29 ENCOUNTER — Encounter: Payer: Self-pay | Admitting: Gastroenterology

## 2023-09-29 VITALS — BP 130/90 | HR 101 | Ht 65.0 in | Wt 299.4 lb

## 2023-09-29 DIAGNOSIS — K625 Hemorrhage of anus and rectum: Secondary | ICD-10-CM | POA: Diagnosis not present

## 2023-09-29 DIAGNOSIS — R194 Change in bowel habit: Secondary | ICD-10-CM

## 2023-09-29 DIAGNOSIS — K2100 Gastro-esophageal reflux disease with esophagitis, without bleeding: Secondary | ICD-10-CM

## 2023-09-29 MED ORDER — NA SULFATE-K SULFATE-MG SULF 17.5-3.13-1.6 GM/177ML PO SOLN: 1.0000 | Freq: Once | ORAL | 0 refills | Status: AC

## 2023-09-29 MED ORDER — OMEPRAZOLE 20 MG PO CPDR: 20.0000 mg | DELAYED_RELEASE_CAPSULE | Freq: Two times a day (BID) | ORAL | 3 refills | Status: AC

## 2023-09-29 NOTE — Patient Instructions (Addendum)
 Recommend GERD diet, no late meals 3-4 hours before lying down Will increase Omeprazole  from 20mg  po daily to twice daily  Recommend high fiber diet Small meals spread throughout the day  OTC Miralax if needed for constipation  You have been scheduled for an endoscopy and colonoscopy. Please follow the written instructions given to you at your visit today.  If you use inhalers (even only as needed), please bring them with you on the day of your procedure.  DO NOT TAKE 7 DAYS PRIOR TO TEST- Trulicity (dulaglutide) Ozempic, Wegovy (semaglutide) Mounjaro (tirzepatide) Bydureon Bcise (exanatide extended release)  DO NOT TAKE 1 DAY PRIOR TO YOUR TEST Rybelsus (semaglutide) Adlyxin (lixisenatide) Victoza (liraglutide) Byetta (exanatide) ___________________________________________________________________________ Due to recent changes in healthcare laws, you may see the results of your imaging and laboratory studies on MyChart before your provider has had a chance to review them.  We understand that in some cases there may be results that are confusing or concerning to you. Not all laboratory results come back in the same time frame and the provider may be waiting for multiple results in order to interpret others.  Please give us  48 hours in order for your provider to thoroughly review all the results before contacting the office for clarification of your results.   _______________________________________________________  If your blood pressure at your visit was 140/90 or greater, please contact your primary care physician to follow up on this.  _______________________________________________________  If you are age 38 or older, your body mass index should be between 23-30. Your Body mass index is 49.82 kg/m. If this is out of the aforementioned range listed, please consider follow up with your Primary Care Provider.  If you are age 54 or younger, your body mass index should be between  19-25. Your Body mass index is 49.82 kg/m. If this is out of the aformentioned range listed, please consider follow up with your Primary Care Provider.   ________________________________________________________  The [facility] providers would like to encourage you to use MYCHART to communicate with providers for non-urgent requests or questions.  Due to long hold times on the telephone, sending your provider a message by [facility] may be a faster and more efficient way to get a response.  Please allow 48 business hours for a response.  Please remember that this is for non-urgent requests.  _______________________________________________________  Thank you for trusting me with your gastrointestinal care. Deanna May, RNP

## 2023-09-29 NOTE — Progress Notes (Signed)
 Chief Complaint:blood in stool, diarrhea Primary GI Doctor: (previously Dr. Sandrea Cruel) Dr. Rosaline Coma  HPI:  Patient is a 58 year old female patient, previously known to Dr. Sandrea Cruel,  with past medical history of GERD, osteoarthritis, asthma, who was self referred to me for a complaint of diarrhea, blood in stool. Patient last seen by Camilo Cella, PA on 08/18/2015 for diarrhea and epigastric pain.  Interval History   Patient presents for evaluation of recent gastrointestinal issues that started after starting Mounjaro for weight loss. She admits over the past year she has been under a lot of stress carrying for her elderly parents which has resulted in a lot of emotional eating and weight gain. Patient started Mounjaro about 7 weeks ago. She states since starting the medication she has had a few episodes of reflux, indigestion, and abdominal discomfort. Patient has history of GERD and currently taking omeprazole  20 mg po daily.     Prior to starting Mounjaro she was regular with bowel movements, but states she has recently had issues with mild constipation. She states she has put on gloves and helped guide stool which she then noticed some BRB with wiping. She also notes few stools with red fecal material in it over the course of a few days. No rectal bleeding currently. Denies rectal pain. She does try to consume high fiber diet which helps.  Nonsmoker. Very seldom drinks.  Patient's family history includes father with prostate CA and uncle with prostate CA.   Wt Readings from Last 3 Encounters:  09/29/23 299 lb 6 oz (135.8 kg)  08/04/23 (!) 305 lb 3.2 oz (138.4 kg)  07/14/23 (!) 301 lb 9.6 oz (136.8 kg)     Past Medical History:  Diagnosis Date   Asthma    Blood transfusion without reported diagnosis    Bursitis    GERD (gastroesophageal reflux disease)    Hemorrhoids    Osteoarthritis    Osteoporosis    Tinnitus    UTI (lower urinary tract infection)     Past Surgical History:  Procedure  Laterality Date   CERVICAL POLYPECTOMY     TOTAL HIP ARTHROPLASTY  10/2008 & 06/2010   bilateral   TOTAL HIP ARTHROPLASTY     2012-left   WISDOM TOOTH EXTRACTION      Current Outpatient Medications  Medication Sig Dispense Refill   Biotin 5 MG CAPS Take 1 capsule by mouth daily. Patient takes 500 mcg     ibuprofen (ADVIL) 200 MG tablet Take 200 mg by mouth as needed.     ketoconazole  (NIZORAL ) 2 % shampoo Apply topically 2 (two) times a week. 120 mL 2   Lysine 500 MG CAPS Take 1 capsule by mouth daily.     minoxidil (LONITEN) 2.5 MG tablet Take 2.5 mg by mouth daily.     omeprazole  (PRILOSEC) 20 MG capsule Take 1 capsule (20 mg total) by mouth daily. 90 capsule 2   OVER THE COUNTER MEDICATION Take 2 capsules by mouth at bedtime.     OVER THE COUNTER MEDICATION Take 1 capsule by mouth at bedtime.     spironolactone  (ALDACTONE ) 50 MG tablet Take 50 mg by mouth daily.     tirzepatide (MOUNJARO) 5 MG/0.5ML Pen Inject 5 mg into the skin once a week. (Patient taking differently: Inject 2.5 mg into the skin once a week.) 6 mL 0   tiZANidine  (ZANAFLEX ) 4 MG capsule Take 1 every 8 hours as needed for muscle spasm 15 capsule 0   amoxicillin  (AMOXIL )  500 MG capsule take 4 tablets 1 hour prior to dental procedure (Patient not taking: Reported on 09/29/2023) 4 capsule 0   meloxicam  (MOBIC ) 15 MG tablet Take 1 tablet (15 mg total) by mouth daily. (Patient not taking: Reported on 09/29/2023) 30 tablet 0   Misc Natural Products (GLUCOSAMINE CHOND COMPLEX/MSM PO) Take by mouth.     Multiple Vitamin (MULTIVITAMIN) tablet Take 1 tablet by mouth daily. (Patient not taking: Reported on 09/29/2023)     No current facility-administered medications for this visit.    Allergies as of 09/29/2023   (No Known Allergies)    Family History  Problem Relation Age of Onset   Hypertension Mother    Diabetes Mother    Bipolar disorder Mother    Prostate cancer Father    Arthritis Sister    Stroke Maternal Grandfather     Lung cancer Paternal Grandfather    Diabetes Brother    Colon cancer Neg Hx    Colon polyps Neg Hx    Esophageal cancer Neg Hx    Rectal cancer Neg Hx    Stomach cancer Neg Hx     Review of Systems:    Constitutional: No weight loss, fever, chills, weakness or fatigue HEENT: Eyes: No change in vision               Ears, Nose, Throat:  No change in hearing or congestion Skin: No rash or itching Cardiovascular: No chest pain, chest pressure or palpitations   Respiratory: No SOB or cough Gastrointestinal: See HPI and otherwise negative Genitourinary: No dysuria or change in urinary frequency Neurological: No headache, dizziness or syncope Musculoskeletal: No new muscle or joint pain Hematologic: No bleeding or bruising Psychiatric: No history of depression or anxiety    Physical Exam:  Vital signs: BP (!) 130/90   Pulse (!) 101   Ht 5\' 5"  (1.651 m)   Wt 299 lb 6 oz (135.8 kg)   BMI 49.82 kg/m   Constitutional:   Pleasant  female appears to be in NAD, Well developed, Well nourished, alert and cooperative Throat: Oral cavity and pharynx without inflammation, swelling or lesion.  Respiratory: Respirations even and unlabored. Lungs clear to auscultation bilaterally.   No wheezes, crackles, or rhonchi.  Cardiovascular: Normal S1, S2. Regular rate and rhythm. No peripheral edema, cyanosis or pallor.  Gastrointestinal:  Soft, nondistended, nontender. No rebound or guarding. Normal bowel sounds. No appreciable masses or hepatomegaly. Rectal:  Not performed.  Msk:  Symmetrical without gross deformities. Without edema, no deformity or joint abnormality.  Neurologic:  Alert and  oriented x4;  grossly normal neurologically.  Skin:   Dry and intact without significant lesions or rashes. Psychiatric: Oriented to person, place and time. Demonstrates good judgement and reason without abnormal affect or behaviors.  RELEVANT LABS AND IMAGING: CBC    Latest Ref Rng & Units 08/04/2023    11:25 AM 07/30/2022   11:03 AM 07/18/2021    9:06 AM  CBC  WBC 4.0 - 10.5 K/uL 7.4  6.3  7.7   Hemoglobin 12.0 - 15.0 g/dL 16.1  09.6  04.5   Hematocrit 36.0 - 46.0 % 43.0  41.6  41.6   Platelets 150.0 - 400.0 K/uL 325.0  321.0  298.0      CMP     Latest Ref Rng & Units 08/04/2023   11:25 AM 07/30/2022   11:03 AM 07/18/2021    9:06 AM  CMP  Glucose 70 - 99 mg/dL 409  94  97   BUN 6 - 23 mg/dL 24  23  21    Creatinine 0.40 - 1.20 mg/dL 4.09  8.11  9.14   Sodium 135 - 145 mEq/L 139  137  137   Potassium 3.5 - 5.1 mEq/L 4.6  4.3  4.4   Chloride 96 - 112 mEq/L 104  105  103   CO2 19 - 32 mEq/L 27  28  27    Calcium 8.4 - 10.5 mg/dL 9.4  9.6  9.8   Total Protein 6.0 - 8.3 g/dL 7.4  7.4  7.2   Total Bilirubin 0.2 - 1.2 mg/dL 0.5  0.5  0.5   Alkaline Phos 39 - 117 U/L 64  56  63   AST 0 - 37 U/L 17  22  17    ALT 0 - 35 U/L 18  17  15       Lab Results  Component Value Date   TSH 3.98 08/04/2023   01/18/2019 colonoscopy with Dr. Sandrea Cruel, recall 3 years Impression:  - One 4 mm polyp in the transverse colon, removed with a cold biopsy forceps. Resected and retrieved.  - Five 7 to 8 mm polyps in the transverse colon and in the cecum, removed with a cold snare. Resected and retrieved.  - Mild diverticulosis in the left colon. There was no evidence of diverticular bleeding.  - The examination was otherwise normal on direct and retroflexion views. Path: Surgical [P], colon, transverse and cecum, polyp (6) - TUBULAR ADENOMA(S). - NO HIGH GRADE DYSPLASIA OR MALIGNANCY. - SESSILE SERRATED POLYP WITHOUT DYSPLASIA. 05/28/2011 EGD with Dr. Sandrea Cruel Endoscopic impression Erosive esophagitis [facility] 01/03/2006 colonoscopy with Dr. Sandrea Cruel Normal exam: cecum to rectum 02/04/2001 colonoscopy with Dr. Sandrea Cruel Normal exam: cecum to sigmoid colon Hemorrhoids: internal: small not bleeding, not thrombosed.  Assessment: Encounter Diagnoses  Name Primary?   Gastroesophageal reflux disease with esophagitis  without hemorrhage Yes   Altered bowel habits    Rectal bleeding     58 year old female patient who presents with worsening GERD symptoms and altered bowel habits with bleeding. She states her symptoms increased after starting Mounjaro about 6 weeks ago. Patient has history of GERD that was managed on Omeprazole  20 mg po daily until she started GLP-1 her symptoms have increased especially at night. Reinforced GERD diet, no late meals and will increase the omeprazole  to twice daily. Encouraged weight loss. Will schedule upper GI endoscopy to r/o esophagitis or Barretts.   For the altered bowel habits we discussed increasing her daily fiber. She has had intermittent BRB with wiping with straining and manually disimpacting herself. She can use OTC Miralax if needed.   Patient has history of tubular adenomas and is overdue for colon screening colonoscopy. Will schedule colonoscopy with 2 day prep in [facility] with Dr. Rosaline Coma.  Plan: - Increase omeprazole  20 mg po daily to 20 mg twice daily -Recommend GERD diet, no late meals -Recommend high fiber diet  -Schedule for a colonoscopy with 2 day prep with Dr. Rosaline Coma in [facility]. The risks and benefits of colonoscopy with possible polypectomy / biopsies were discussed and the patient agrees to proceed.  -Schedule EGD in [facility] with Dr.Dorsey.  The risks and benefits of EGD with possible biopsies and esophageal dilation were discussed with the patient who agrees to proceed. -Hold Mounjaro 1 week prior to procedures  Thank you for the courtesy of this consult. Please call me with any questions or concerns.   Marili Vader, FNP-C [facility] 09/29/2023,  3:10 PM  Cc: Marquetta Sit, MD

## 2023-09-29 NOTE — Progress Notes (Signed)
 I agree with the assessment and plan as outlined by Ms. May.

## 2023-10-15 ENCOUNTER — Telehealth: Payer: Self-pay

## 2023-10-15 MED ORDER — AMOXICILLIN 500 MG PO CAPS: ORAL_CAPSULE | ORAL | 0 refills | Status: DC

## 2023-10-15 NOTE — Telephone Encounter (Signed)
 Copied from CRM [id number]. Topic: Clinical - Medication Question >> Oct 15, 2023 11:38 AM [place] J wrote: Reason for CRM: Patient has a dentist appointment and was wondering if Dr. Darren Em could send in a single capsule of amoxicillin  (AMOXIL ) 500 MG before her appointment on Friday. She stated that she needs to take it before 10:00 AM and that Dr. Darren Em has accommodated this request before.  Preferred pharmacy:  [id number] Jonette Nestle, [place] - [address] AT [facility] [address] [facility] [address] [facility] [address] [place] [address] Phone: [phone] Fax: [phone] Hours: Not open 24 hours ----------------------------------------------------------------------- From previous Reason for Contact - Medical Advice:

## 2023-10-15 NOTE — Telephone Encounter (Signed)
 Patient informed of the message below. Rx sent per request.

## 2023-10-15 NOTE — Addendum Note (Signed)
 Addended by: Aurelio Leer on: 10/15/2023 01:07 PM   Modules accepted: Orders

## 2023-10-21 ENCOUNTER — Encounter (HOSPITAL_COMMUNITY): Payer: Self-pay | Admitting: Obstetrics and Gynecology

## 2023-10-21 NOTE — Progress Notes (Addendum)
 Spoke w/ via phone for pre-op interview--- Abigail [name] needs dos---- BMP and EKG per anesthesia.        [name] results------ COVID test -----patient states asymptomatic no test needed Arrive at -------0530 NPO after MN NO Solid Food.   Pre-Surgery Ensure or G2:  Med rec completed Medications to take morning of surgery ----- Prilosec Diabetic medication -----  GLP1 agonist last dose: Mounjaro  5mg . Last dose 10/15/23. GLP1 instructions: Hold any further doses until after surgery, patient verbalized understanding of these instructions.  Patient instructed no nail polish to be worn day of surgery Patient instructed to bring photo id and insurance card day of surgery Patient aware to have Driver (ride ) / caregiver    for 24 hours after surgery -  Husband Abigail [name] Patient Special Instructions ----- Shower with antibacterial soap. Pre-Op special Instructions -----  Patient verbalized understanding of instructions that were given at this phone interview. Patient denies chest pain, sob, fever, cough at the interview.

## 2023-10-24 NOTE — H&P (Signed)
 Abigail [name] is an 58 y.o. female presents for hysteroscopy D&C for evaluation of thickened endometrium and irregular bleeding  Menstrual History:  No LMP recorded. Patient is perimenopausal.    Past Medical History:  Diagnosis Date   Asthma    Blood transfusion without reported diagnosis    Bursitis    GERD (gastroesophageal reflux disease)    Hemorrhoids    Osteoarthritis    Osteoporosis    Tinnitus    UTI (lower urinary tract infection)     Past Surgical History:  Procedure Laterality Date   CERVICAL POLYPECTOMY     TOTAL HIP ARTHROPLASTY  10/2008 & 06/2010   bilateral   TOTAL HIP ARTHROPLASTY     2012-left   WISDOM TOOTH EXTRACTION      Family History  Problem Relation Age of Onset   Hypertension Mother    Diabetes Mother    Bipolar disorder Mother    Prostate cancer Father    Arthritis Sister    Stroke Maternal Grandfather    Lung cancer Paternal Grandfather    Diabetes Brother    Colon cancer Neg Hx    Colon polyps Neg Hx    Esophageal cancer Neg Hx    Rectal cancer Neg Hx    Stomach cancer Neg Hx     Social History:  reports that she has never smoked. She has never used smokeless tobacco. She reports that she does not drink alcohol and does not use drugs.  Allergies: No Known Allergies  Meds:  amoxicillin , doxycyclin, ketoconazole  shampoo, meloxicam , minoxidil, mounjaro , omeprazole , prednisone , spironolactone , tizanidine ,      Height 5' 5 (1.651 m), weight 135.6 kg. Physical Exam Gen - NAD CV - RRR LUngs - clear bilaterally Abd - soft, NT PV - cervical stenosis, normal EFG, uterus NT  PV US :  uterus normal, EMS 5.20mm  no adnexal mass or free fluid  Assessment/Plan: Irregular bleeding with thickened endometrium and cervical stenosis Recommend D&C with possible hysteroscopy  Truman Corona 10/24/2023, 1:13 PM

## 2023-10-26 NOTE — Anesthesia Preprocedure Evaluation (Addendum)
 Anesthesia Evaluation  Patient identified by MRN, date of birth, ID band Patient awake    Reviewed: Allergy & Precautions, NPO status , Patient's Chart, lab work & pertinent test results  History of Anesthesia Complications Negative for: history of anesthetic complications  Airway Mallampati: II  TM Distance: >3 FB Neck ROM: Full    Dental no notable dental hx.    Pulmonary asthma    Pulmonary exam normal        Cardiovascular hypertension, Pt. on medications Normal cardiovascular exam     Neuro/Psych negative neurological ROS     GI/Hepatic Neg liver ROS,GERD  Medicated and Controlled,,  Endo/Other    Class 3 obesityOn tirzepatide   Renal/GU negative Renal ROS     Musculoskeletal  (+) Arthritis ,    Abdominal   Peds  Hematology negative hematology ROS (+)   Anesthesia Other Findings Day of surgery medications reviewed with patient.  Reproductive/Obstetrics AUB                              Anesthesia Physical Anesthesia Plan  ASA: 3  Anesthesia Plan: General   Post-op Pain Management: Tylenol PO (pre-op)*   Induction: Intravenous  PONV Risk Score and Plan: 3 and Ondansetron, Dexamethasone, Treatment may vary due to age or medical condition, Midazolam, Propofol infusion, TIVA and Scopolamine patch - Pre-op  Airway Management Planned: LMA  Additional Equipment: None  Intra-op Plan:   Post-operative Plan: Extubation in OR  Informed Consent: I have reviewed the patients History and Physical, chart, labs and discussed the procedure including the risks, benefits and alternatives for the proposed anesthesia with the patient or authorized representative who has indicated his/her understanding and acceptance.     Dental advisory given  Plan Discussed with: CRNA  Anesthesia Plan Comments:         Anesthesia Quick Evaluation

## 2023-10-27 ENCOUNTER — Ambulatory Visit (HOSPITAL_COMMUNITY)
Admission: RE | Admit: 2023-10-27 | Discharge: 2023-10-27 | Disposition: A | Discharge status: Home / Self Care | Source: Ambulatory Visit | Attending: Obstetrics and Gynecology | Admitting: Obstetrics and Gynecology

## 2023-10-27 ENCOUNTER — Other Ambulatory Visit: Payer: Self-pay

## 2023-10-27 ENCOUNTER — Ambulatory Visit (HOSPITAL_BASED_OUTPATIENT_CLINIC_OR_DEPARTMENT_OTHER): Admitting: Anesthesiology

## 2023-10-27 ENCOUNTER — Encounter (HOSPITAL_COMMUNITY)
Admission: RE | Disposition: A | Discharge status: Home / Self Care | Payer: Self-pay | Source: Ambulatory Visit | Attending: Obstetrics and Gynecology

## 2023-10-27 ENCOUNTER — Encounter (HOSPITAL_COMMUNITY): Admitting: Anesthesiology

## 2023-10-27 ENCOUNTER — Encounter (HOSPITAL_COMMUNITY): Payer: Self-pay | Admitting: Obstetrics and Gynecology

## 2023-10-27 DIAGNOSIS — I10 Essential (primary) hypertension: Secondary | ICD-10-CM

## 2023-10-27 DIAGNOSIS — J45909 Unspecified asthma, uncomplicated: Secondary | ICD-10-CM | POA: Diagnosis not present

## 2023-10-27 DIAGNOSIS — N926 Irregular menstruation, unspecified: Secondary | ICD-10-CM

## 2023-10-27 DIAGNOSIS — R9389 Abnormal findings on diagnostic imaging of other specified body structures: Secondary | ICD-10-CM

## 2023-10-27 DIAGNOSIS — N939 Abnormal uterine and vaginal bleeding, unspecified: Secondary | ICD-10-CM | POA: Diagnosis present

## 2023-10-27 DIAGNOSIS — K219 Gastro-esophageal reflux disease without esophagitis: Secondary | ICD-10-CM | POA: Diagnosis not present

## 2023-10-27 HISTORY — PX: DILATATION & CURRETTAGE/HYSTEROSCOPY WITH RESECTOCOPE: SHX5572

## 2023-10-27 LAB — CBC
HCT: 41.7 % (ref 36.0–46.0)
Hemoglobin: 13.6 g/dL (ref 12.0–15.0)
MCH: 29.4 pg (ref 26.0–34.0)
MCHC: 32.6 g/dL (ref 30.0–36.0)
MCV: 90.3 fL (ref 80.0–100.0)
Platelets: 329 10*3/uL (ref 150–400)
RBC: 4.62 MIL/uL (ref 3.87–5.11)
RDW: 13.6 % (ref 11.5–15.5)
WBC: 9.1 10*3/uL (ref 4.0–10.5)
nRBC: 0 % (ref 0.0–0.2)

## 2023-10-27 LAB — BASIC METABOLIC PANEL WITH GFR
Anion gap: 10 (ref 5–15)
BUN: 18 mg/dL (ref 6–20)
CO2: 23 mmol/L (ref 22–32)
Calcium: 8.7 mg/dL — ABNORMAL LOW (ref 8.9–10.3)
Chloride: 104 mmol/L (ref 98–111)
Creatinine, Ser: 0.56 mg/dL (ref 0.44–1.00)
GFR, Estimated: >60 mL/min (ref >60)
Glucose, Bld: 95 mg/dL (ref 70–99)
Potassium: 3.9 mmol/L (ref 3.5–5.1)
Sodium: 137 mmol/L (ref 135–145)

## 2023-10-27 LAB — TYPE AND SCREEN
ABO/RH(D): O POS
Antibody Screen: NEGATIVE

## 2023-10-27 LAB — POCT PREGNANCY, URINE: Preg Test, Ur: NEGATIVE

## 2023-10-27 SURGERY — DILATATION & CURETTAGE/HYSTEROSCOPY WITH RESECTOCOPE
Anesthesia: General | Site: Uterus

## 2023-10-27 MED ORDER — CHLORHEXIDINE GLUCONATE 0.12 % MT SOLN: 15.0000 mL | Freq: Once | OROMUCOSAL | Status: AC

## 2023-10-27 MED ORDER — SCOPOLAMINE 1 MG/3DAYS TD PT72
MEDICATED_PATCH | TRANSDERMAL | Status: DC
Start: 2023-10-27 — End: 2023-10-27
  Administered 2023-10-27: 1.5 mg via TRANSDERMAL
  Filled 2023-10-27: qty 1

## 2023-10-27 MED ORDER — PROPOFOL 10 MG/ML IV BOLUS
INTRAVENOUS | Status: AC
  Filled 2023-10-27: qty 20

## 2023-10-27 MED ORDER — LACTATED RINGERS IV SOLN: INTRAVENOUS | Status: DC

## 2023-10-27 MED ORDER — FENTANYL CITRATE (PF) 250 MCG/5ML IJ SOLN
INTRAMUSCULAR | Status: DC | PRN
Start: 2023-10-27 — End: 2023-10-27
  Administered 2023-10-27: 50 ug via INTRAVENOUS
  Administered 2023-10-27 (×2): 25 ug via INTRAVENOUS

## 2023-10-27 MED ORDER — POVIDONE-IODINE 10 % EX SWAB: 2.0000 | Freq: Once | CUTANEOUS | Status: DC

## 2023-10-27 MED ORDER — ACETAMINOPHEN 500 MG PO TABS: 1000.0000 mg | ORAL_TABLET | Freq: Once | ORAL | Status: DC

## 2023-10-27 MED ORDER — LIDOCAINE 2% (20 MG/ML) 5 ML SYRINGE
INTRAMUSCULAR | Status: DC | PRN
  Administered 2023-10-27: 100 mg via INTRAVENOUS

## 2023-10-27 MED ORDER — LIDOCAINE HCL (PF) 1 % IJ SOLN
INTRAMUSCULAR | Status: AC
  Filled 2023-10-27: qty 30

## 2023-10-27 MED ORDER — CHLORHEXIDINE GLUCONATE 0.12 % MT SOLN
OROMUCOSAL | Status: AC
  Administered 2023-10-27: 15 mL via OROMUCOSAL
  Filled 2023-10-27: qty 15

## 2023-10-27 MED ORDER — ACETAMINOPHEN 10 MG/ML IV SOLN
INTRAVENOUS | Status: AC
  Filled 2023-10-27: qty 100

## 2023-10-27 MED ORDER — ACETAMINOPHEN 500 MG PO TABS
ORAL_TABLET | ORAL | Status: AC
  Filled 2023-10-27: qty 2

## 2023-10-27 MED ORDER — PROPOFOL 10 MG/ML IV BOLUS
INTRAVENOUS | Status: DC | PRN
  Administered 2023-10-27: 40 mg via INTRAVENOUS
  Administered 2023-10-27: 200 mg via INTRAVENOUS
  Administered 2023-10-27: 30 mg via INTRAVENOUS
  Administered 2023-10-27: 50 mg via INTRAVENOUS
  Administered 2023-10-27: 30 mg via INTRAVENOUS

## 2023-10-27 MED ORDER — MIDAZOLAM HCL 2 MG/2ML IJ SOLN
INTRAMUSCULAR | Status: DC | PRN
  Administered 2023-10-27: 2 mg via INTRAVENOUS

## 2023-10-27 MED ORDER — PROPOFOL 500 MG/50ML IV EMUL
INTRAVENOUS | Status: DC | PRN
  Administered 2023-10-27: 135 ug/kg/min via INTRAVENOUS

## 2023-10-27 MED ORDER — ORAL CARE MOUTH RINSE: 15.0000 mL | Freq: Once | OROMUCOSAL | Status: AC

## 2023-10-27 MED ORDER — SCOPOLAMINE 1 MG/3DAYS TD PT72: 1.0000 | MEDICATED_PATCH | Freq: Once | TRANSDERMAL | Status: DC

## 2023-10-27 MED ORDER — DEXAMETHASONE SODIUM PHOSPHATE 10 MG/ML IJ SOLN
INTRAMUSCULAR | Status: DC | PRN
  Administered 2023-10-27: 10 mg via INTRAVENOUS

## 2023-10-27 MED ORDER — CEFAZOLIN SODIUM-DEXTROSE 3-4 GM/150ML-% IV SOLN
3.0000 g | INTRAVENOUS | Status: AC
  Administered 2023-10-27: 3 g via INTRAVENOUS
  Filled 2023-10-27: qty 150

## 2023-10-27 MED ORDER — DROPERIDOL 2.5 MG/ML IJ SOLN: 0.6250 mg | Freq: Once | INTRAMUSCULAR | Status: DC | PRN

## 2023-10-27 MED ORDER — LIDOCAINE HCL 1 % IJ SOLN
INTRAMUSCULAR | Status: DC | PRN
  Administered 2023-10-27: 5 mL

## 2023-10-27 MED ORDER — PROPOFOL 1000 MG/100ML IV EMUL
INTRAVENOUS | Status: AC
  Filled 2023-10-27: qty 100

## 2023-10-27 MED ORDER — ONDANSETRON HCL 4 MG/2ML IJ SOLN
INTRAMUSCULAR | Status: DC | PRN
  Administered 2023-10-27: 4 mg via INTRAVENOUS

## 2023-10-27 MED ORDER — ACETAMINOPHEN 10 MG/ML IV SOLN
INTRAVENOUS | Status: DC | PRN
  Administered 2023-10-27: 1000 mg via INTRAVENOUS

## 2023-10-27 MED ORDER — MIDAZOLAM HCL 2 MG/2ML IJ SOLN
INTRAMUSCULAR | Status: AC
  Filled 2023-10-27: qty 2

## 2023-10-27 MED ORDER — OXYCODONE HCL 5 MG PO TABS: 5.0000 mg | ORAL_TABLET | Freq: Once | ORAL | Status: DC | PRN

## 2023-10-27 MED ORDER — OXYCODONE HCL 5 MG/5ML PO SOLN: 5.0000 mg | Freq: Once | ORAL | Status: DC | PRN

## 2023-10-27 MED ORDER — FENTANYL CITRATE (PF) 100 MCG/2ML IJ SOLN
INTRAMUSCULAR | Status: AC
  Filled 2023-10-27: qty 2

## 2023-10-27 MED ORDER — FENTANYL CITRATE (PF) 100 MCG/2ML IJ SOLN
25.0000 ug | INTRAMUSCULAR | Status: DC | PRN
  Administered 2023-10-27: 50 ug via INTRAVENOUS

## 2023-10-27 MED ORDER — FENTANYL CITRATE (PF) 250 MCG/5ML IJ SOLN
INTRAMUSCULAR | Status: AC
  Filled 2023-10-27: qty 5

## 2023-10-27 MED ORDER — SODIUM CHLORIDE 0.9 % IR SOLN
Status: DC | PRN
  Administered 2023-10-27: 3000 mL

## 2023-10-27 SURGICAL SUPPLY — 16 items
CANISTER SUCTION 3000ML PPV (SUCTIONS) ×1 IMPLANT
CATH ROBINSON RED A/P 16FR (CATHETERS) ×1 IMPLANT
COVER MAYO STAND STRL (DRAPES) ×1 IMPLANT
CURETTE PIPELLE ENDOMTRL SUCTN (MISCELLANEOUS) IMPLANT
DEVICE MYOSURE LITE (MISCELLANEOUS) IMPLANT
DEVICE MYOSURE REACH (MISCELLANEOUS) IMPLANT
GLOVE BIO SURGEON STRL SZ 6.5 (GLOVE) ×1 IMPLANT
GLOVE SURG UNDER POLY LF SZ7 (GLOVE) ×2 IMPLANT
GOWN STRL REUS W/ TWL LRG LVL3 (GOWN DISPOSABLE) ×2 IMPLANT
KIT PROCEDURE FLUENT (KITS) ×1 IMPLANT
KIT TURNOVER KIT B (KITS) ×1 IMPLANT
PACK VAGINAL MINOR WOMEN LF (CUSTOM PROCEDURE TRAY) ×1 IMPLANT
PAD OB MATERNITY 11 LF (PERSONAL CARE ITEMS) ×1 IMPLANT
SEAL ROD LENS SCOPE MYOSURE (ABLATOR) ×1 IMPLANT
TOWEL GREEN STERILE FF (TOWEL DISPOSABLE) ×2 IMPLANT
UNDERPAD 30X36 HEAVY ABSORB (UNDERPADS AND DIAPERS) ×1 IMPLANT

## 2023-10-27 NOTE — Discharge Instr - Supplementary Instructions (Signed)
 May take Tylenol after 2pm if needed for discomfort.

## 2023-10-27 NOTE — Anesthesia Procedure Notes (Signed)
 Procedure Name: LMA Insertion Date/Time: 10/27/2023 7:37 AM  Performed by: Jolynn Mage, CRNAPre-anesthesia Checklist: Patient identified, Emergency Drugs available, Suction available and Patient being monitored Patient Re-evaluated:Patient Re-evaluated prior to induction Oxygen Delivery Method: Circle system utilized Preoxygenation: Pre-oxygenation with 100% oxygen Induction Type: IV induction Ventilation: Mask ventilation without difficulty LMA: LMA flexible inserted LMA Size: 4.0 Number of attempts: 1 Placement Confirmation: positive ETCO2 and breath sounds checked- equal and bilateral Tube secured with: Tape Dental Injury: Teeth and Oropharynx as per pre-operative assessment

## 2023-10-27 NOTE — Anesthesia Postprocedure Evaluation (Signed)
 Anesthesia Post Note  Patient: Abigail [name]  Procedure(s) Performed: DILATATION & CURETTAGE/HYSTEROSCOPY WITH myosure (Uterus)     Patient location during evaluation: PACU Anesthesia Type: General Level of consciousness: awake and alert Pain management: pain level controlled Vital Signs Assessment: post-procedure vital signs reviewed and stable Respiratory status: spontaneous breathing, nonlabored ventilation and respiratory function stable Cardiovascular status: blood pressure returned to baseline Postop Assessment: no apparent nausea or vomiting Anesthetic complications: no   No notable events documented.  Last Vitals:  Vitals:   10/27/23 0626 10/27/23 0820  BP: (!) 146/86 (!) 152/95  Pulse: 92 75  Resp: 18 19  Temp: 37.2 C 36.6 C  SpO2: 95% 97%    Last Pain:  Vitals:   10/27/23 0820  TempSrc:   PainSc: 4                  Vertell Row

## 2023-10-27 NOTE — Transfer of Care (Signed)
 Immediate Anesthesia Transfer of Care Note  Patient: Abigail [name]  Procedure(s) Performed: DILATATION & CURETTAGE/HYSTEROSCOPY WITH myosure (Uterus)  Patient Location: PACU  Anesthesia Type:General  Level of Consciousness: awake, alert , oriented, patient cooperative, and responds to stimulation  Airway & Oxygen Therapy: Patient Spontanous Breathing and Patient connected to face mask oxygen  Post-op Assessment: Report given to RN, Post -op Vital signs reviewed and stable, and Patient moving all extremities X 4  Post vital signs: Reviewed and stable  Last Vitals:  Vitals Value Taken Time  BP 147/92 10/27/23 08:30  Temp 36.6 C 10/27/23 08:20  Pulse 74 10/27/23 08:34  Resp 12 10/27/23 08:34  SpO2 97 % 10/27/23 08:34  Vitals shown include unfiled device data.  Last Pain:  Vitals:   10/27/23 0820  TempSrc:   PainSc: 4       Patients Stated Pain Goal: 6 (10/27/23 0626)  Complications: No notable events documented.

## 2023-10-27 NOTE — Progress Notes (Signed)
 Patient ID: Abigail [name], female   DOB: October 10, 1965, 58 y.o.   MRN: [id number]  Reviewed procedure with patient R/b/a discussed, questions answered, informed consent obtained   Truman Corona, MD 10/27/2023, 7:21 AM    Patient ID: Abigail [name], female   DOB: [birth year], 58 y.o.   MRN: [id number]

## 2023-10-27 NOTE — Op Note (Signed)
 Pre op dx:  Thickened endometrium  Post op dx: same  Procedure:  paracervical block, Hysteroscopy D&C  Surgeon: Truman Corona, MD  EBL:  minimal  Specimen: endometrial curettings  Anesthesia:general  Complications:  none  Condition:  stable  Pt was taken to the OR 7 after informed consent.  Anesthesia was given and she was placed in dorsal lithotomy position.  Prepped and draped in sterile condition.  Bivalve speculum placed in vagina and 1% lidocaine was injected into the anterior lip of the cervix.  A single tooth tenaculum placed on anterior lip of cervix and paracervical block was done.  Cervix was serially dilated with pratt dilators.  Diagnostic hysteroscope inserted and survey of intrauterine cavity performed.  A few areas of fluffy endometrium noted on side walls.  No polyps or fibroids identified.  Cavity appeared long and narrow.  Bilateral ostia identified.   Myosure was used to remove the areas of fluffy endometrium.  Hysteroscope removed and a gentle curetting performed.  Specimen placed on telfa and passed off to be sent to pathology.  Sponge, needle, and instrument counts correct.

## 2023-10-27 NOTE — Discharge Instructions (Addendum)
[facility] office 2-3 weeks for postop appointment.  Call the office [phone] for an appointment. ° °Personal Hygiene: °Use pads not tampons x 1week °You may shower, no tub baths or pools for 2-3 weeks °Wipe from front to back when using restroom ° °Activity: °Do not drive or operate any equipment for 24 hrs.   °Do not rest in bed all day °Walking is encouraged °Walk up and down stairs slowly °You may return to your normal activity in 1-2 days ° °Sexual Activity:  No intercourse for 2 weeks after the procedure. ° °Diet: Eat a light meal as desired this evening.  You may resume your usual diet tomorrow. ° °Return to work:  You may resume your work activities after 1-2 days ° °What to expect:  Expect to have vaginal bleeding/discharge for 2-3 days and spotting for 10-14 days.  It is not unusual to have soreness for 1-2 weeks.  You may have a slight burning sensation when you urinate for the first few days.  You may start your menses in 2-6 weeks.  Mild cramps may continue for a couple of days.   ° °Call your doctor:   °Excessive bleeding, saturating a pad every hour °Inability to urinate 6 hours after discharge °Pain not relieved with pain medications °Fever of 100.4 or greater ° ° °Post Anesthesia Home Care Instructions ° °Activity: °Get plenty of rest for the remainder of the day. A responsible adult should stay with you for 24 hours following the procedure.  °For the next 24 hours, DO NOT: °-Drive a car °-Operate machinery °-Drink alcoholic beverages °-Take any medication unless instructed by your physician °-Make any legal decisions or sign important papers. ° °Meals: °Start with liquid foods such as gelatin or soup. Progress to regular foods as tolerated. Avoid greasy, spicy, heavy foods. If nausea and/or vomiting occur, drink only clear liquids until the nausea and/or vomiting subsides. Call your physician if vomiting continues. ° °Special Instructions/Symptoms: °Your throat may feel dry or sore from the anesthesia or  the breathing tube placed in your throat during surgery. If this causes discomfort, gargle with warm salt water. The discomfort should disappear within 24 hours. ° °If you had a scopolamine patch placed behind your ear for the management of post- operative nausea and/or vomiting: ° °1. The medication in the patch is effective for 72 hours, after which it should be removed.  Wrap patch in a tissue and discard in the trash. Wash hands thoroughly with soap and water. °2. You may remove the patch earlier than 72 hours if you experience unpleasant side effects which may include dry mouth, dizziness or visual disturbances. °3. Avoid touching the patch. Wash your hands with soap and water after contact with the patch. °  ° °

## 2023-10-28 ENCOUNTER — Encounter (HOSPITAL_COMMUNITY): Payer: Self-pay | Admitting: Obstetrics and Gynecology

## 2023-10-28 LAB — SURGICAL PATHOLOGY

## 2023-11-05 ENCOUNTER — Ambulatory Visit
Admission: RE | Admit: 2023-11-05 | Discharge: 2023-11-05 | Disposition: A | Discharge status: Home / Self Care | Source: Ambulatory Visit | Attending: Obstetrics and Gynecology | Admitting: Obstetrics and Gynecology

## 2023-11-05 DIAGNOSIS — N63 Unspecified lump in unspecified breast: Secondary | ICD-10-CM

## 2023-11-25 ENCOUNTER — Telehealth: Payer: Self-pay | Admitting: Gastroenterology

## 2023-11-25 NOTE — Telephone Encounter (Signed)
 Called and spoke with patient, she is relieved that she can keep her appt as scheduled. Patient verbalized understanding and had no concerns at the end of the call.

## 2023-11-25 NOTE — Telephone Encounter (Signed)
 Inbound call from patient stating she did not realize she had to withhold medication Mounjaro  7 days prior to procedure and she is scheduled for an endo/colon on 8/4. Patient would like to know if theres anything she can do since she did take medication yesterday night at 11:30pm  Requesting a call back   Please advise  Thank you

## 2023-12-01 ENCOUNTER — Ambulatory Visit (AMBULATORY_SURGERY_CENTER): Admitting: Internal Medicine

## 2023-12-01 ENCOUNTER — Encounter: Payer: Self-pay | Admitting: Internal Medicine

## 2023-12-01 VITALS — BP 117/74 | HR 85 | Temp 97.8°F | Resp 10 | Ht 65.0 in | Wt 294.0 lb

## 2023-12-01 DIAGNOSIS — Z1211 Encounter for screening for malignant neoplasm of colon: Secondary | ICD-10-CM

## 2023-12-01 DIAGNOSIS — K449 Diaphragmatic hernia without obstruction or gangrene: Secondary | ICD-10-CM | POA: Diagnosis not present

## 2023-12-01 DIAGNOSIS — Z860100 Personal history of colon polyps, unspecified: Secondary | ICD-10-CM

## 2023-12-01 DIAGNOSIS — K635 Polyp of colon: Secondary | ICD-10-CM

## 2023-12-01 DIAGNOSIS — K3189 Other diseases of stomach and duodenum: Secondary | ICD-10-CM | POA: Diagnosis not present

## 2023-12-01 DIAGNOSIS — K648 Other hemorrhoids: Secondary | ICD-10-CM | POA: Diagnosis not present

## 2023-12-01 DIAGNOSIS — D122 Benign neoplasm of ascending colon: Secondary | ICD-10-CM

## 2023-12-01 DIAGNOSIS — K5730 Diverticulosis of large intestine without perforation or abscess without bleeding: Secondary | ICD-10-CM | POA: Diagnosis not present

## 2023-12-01 DIAGNOSIS — K2100 Gastro-esophageal reflux disease with esophagitis, without bleeding: Secondary | ICD-10-CM

## 2023-12-01 DIAGNOSIS — D120 Benign neoplasm of cecum: Secondary | ICD-10-CM

## 2023-12-01 DIAGNOSIS — K317 Polyp of stomach and duodenum: Secondary | ICD-10-CM

## 2023-12-01 DIAGNOSIS — D123 Benign neoplasm of transverse colon: Secondary | ICD-10-CM

## 2023-12-01 MED ORDER — SODIUM CHLORIDE 0.9 % IV SOLN: 500.0000 mL | INTRAVENOUS | Status: DC

## 2023-12-01 NOTE — Progress Notes (Signed)
 GASTROENTEROLOGY PROCEDURE H&P NOTE   Primary Care Physician: Micheal Wolm ORN, MD    Reason for Procedure:   GERD, history of colon polyps  Plan:    EGD/colonoscopy  Patient is appropriate for endoscopic procedure(s) in the ambulatory ([facility]) setting.  The nature of the procedure, as well as the risks, benefits, and alternatives were carefully and thoroughly reviewed with the patient. Ample time for discussion and questions allowed. The patient understood, was satisfied, and agreed to proceed.     HPI: Abigail [name] is a 58 y.o. female who presents for EGD/colonoscopy for evaluation of GERD, history of colon polyps.  Patient was most recently seen in the Gastroenterology Clinic on 09/29/23.  No interval change in medical history since that appointment. Please refer to that note for full details regarding GI history and clinical presentation.   Past Medical History:  Diagnosis Date   Asthma    Blood transfusion without reported diagnosis    Bursitis    GERD (gastroesophageal reflux disease)    Hemorrhoids    Osteoarthritis    Osteoporosis    Tinnitus    UTI (lower urinary tract infection)    x 1    Past Surgical History:  Procedure Laterality Date   CERVICAL POLYPECTOMY     DILATATION & CURRETTAGE/HYSTEROSCOPY WITH RESECTOCOPE N/A 10/27/2023   Procedure: DILATATION & CURETTAGE/HYSTEROSCOPY WITH myosure;  Surgeon: Latisha Medford, MD;  Location: [facility] OR;  Service: Gynecology;  Laterality: N/A;  MYOSURE (SMALLEST SCOPE)   TOTAL HIP ARTHROPLASTY  10/2008 & 06/2010   bilateral   TOTAL HIP ARTHROPLASTY     2012-left   WISDOM TOOTH EXTRACTION      Prior to Admission medications   Medication Sig Start Date End Date Taking? Authorizing Provider  ketoconazole  (NIZORAL ) 2 % shampoo Apply topically 2 (two) times a week. 02/04/22  Yes Burchette, Wolm ORN, MD  Potassium (POTASSIMIN PO) Take by mouth.   Yes [provider]  amoxicillin  (AMOXIL ) 500 MG capsule take 4 tablets  1 hour prior to dental procedure 10/15/23   Burchette, Wolm ORN, MD  Apple Cider Vinegar 600 MG CAPS Take by mouth.    [provider]  Biotin 5 MG CAPS Take 1 capsule by mouth daily. Patient takes 500 mcg Patient not taking: Reported on 10/21/2023    [provider]  ibuprofen (ADVIL) 200 MG tablet Take 200 mg by mouth as needed.    [provider]  Lysine 500 MG CAPS Take 1 capsule by mouth daily.    [provider]  meloxicam  (MOBIC ) 15 MG tablet Take 1 tablet (15 mg total) by mouth daily. Patient not taking: Reported on 09/29/2023 04/17/23   Leonce Katz, DO  minoxidil (LONITEN) 2.5 MG tablet Take 2.5 mg by mouth daily.    [provider]  MOUNJARO  2.5 MG/0.5ML Pen ADMINISTER 2.5 MG UNDER THE SKIN 1 TIME A WEEK    [provider]  Multiple Vitamin (MULTIVITAMIN) tablet Take 1 tablet by mouth daily.    [provider]  omeprazole  (PRILOSEC) 20 MG capsule Take 1 capsule (20 mg total) by mouth daily. Patient not taking: Reported on 10/21/2023 10/05/19   Micheal Wolm ORN, MD  omeprazole  (PRILOSEC) 20 MG capsule Take 1 capsule (20 mg total) by mouth 2 (two) times daily before a meal. 09/29/23   May, Deanna J, NP  OVER THE COUNTER MEDICATION Take 1 capsule by mouth daily. Nutrafol    [provider]  spironolactone  (ALDACTONE ) 50 MG tablet Take  50 mg by mouth daily.    [provider]  tirzepatide  (MOUNJARO ) 5 MG/0.5ML Pen Inject 5 mg into the skin once a week. 09/09/23   Burchette, Wolm ORN, MD  tiZANidine  (ZANAFLEX ) 4 MG capsule Take 1 every 8 hours as needed for muscle spasm Patient not taking: Reported on 10/21/2023 07/11/23   Micheal Wolm ORN, MD  Turmeric (QC TUMERIC COMPLEX) 500 MG CAPS Take by mouth.    [provider]    Current Outpatient Medications  Medication Sig Dispense Refill   ketoconazole  (NIZORAL ) 2 % shampoo Apply topically 2 (two) times a week. 120 mL 2   Potassium (POTASSIMIN PO) Take by  mouth.     amoxicillin  (AMOXIL ) 500 MG capsule take 4 tablets 1 hour prior to dental procedure 4 capsule 0   Apple Cider Vinegar 600 MG CAPS Take by mouth.     Biotin 5 MG CAPS Take 1 capsule by mouth daily. Patient takes 500 mcg (Patient not taking: Reported on 10/21/2023)     ibuprofen (ADVIL) 200 MG tablet Take 200 mg by mouth as needed.     Lysine 500 MG CAPS Take 1 capsule by mouth daily.     meloxicam  (MOBIC ) 15 MG tablet Take 1 tablet (15 mg total) by mouth daily. (Patient not taking: Reported on 09/29/2023) 30 tablet 0   minoxidil (LONITEN) 2.5 MG tablet Take 2.5 mg by mouth daily.     MOUNJARO  2.5 MG/0.5ML Pen ADMINISTER 2.5 MG UNDER THE SKIN 1 TIME A WEEK     Multiple Vitamin (MULTIVITAMIN) tablet Take 1 tablet by mouth daily.     omeprazole  (PRILOSEC) 20 MG capsule Take 1 capsule (20 mg total) by mouth daily. (Patient not taking: Reported on 10/21/2023) 90 capsule 2   omeprazole  (PRILOSEC) 20 MG capsule Take 1 capsule (20 mg total) by mouth 2 (two) times daily before a meal. 180 capsule 3   OVER THE COUNTER MEDICATION Take 1 capsule by mouth daily. Nutrafol     spironolactone  (ALDACTONE ) 50 MG tablet Take 50 mg by mouth daily.     tirzepatide  (MOUNJARO ) 5 MG/0.5ML Pen Inject 5 mg into the skin once a week. 6 mL 0   tiZANidine  (ZANAFLEX ) 4 MG capsule Take 1 every 8 hours as needed for muscle spasm (Patient not taking: Reported on 10/21/2023) 15 capsule 0   Turmeric (QC TUMERIC COMPLEX) 500 MG CAPS Take by mouth.     Current Facility-Administered Medications  Medication Dose Route Frequency Provider Last Rate Last Admin   0.9 %  sodium chloride  infusion  500 mL Intravenous Continuous Federico Rosario BROCKS, MD        Allergies as of 12/01/2023   (No Known Allergies)    Family History  Problem Relation Age of Onset   Hypertension Mother    Diabetes Mother    Bipolar disorder Mother    Prostate cancer Father    Arthritis Sister    Stroke Maternal Grandfather    Lung cancer Paternal  Grandfather    Diabetes Brother    Colon cancer Neg Hx    Colon polyps Neg Hx    Esophageal cancer Neg Hx    Rectal cancer Neg Hx    Stomach cancer Neg Hx     Social History   Socioeconomic History   Marital status: Married    Spouse name: Not on file   Number of children: 0   Years of education: Not on file   Highest education level: Not on file  Occupational History   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, production   Tobacco Use   Smoking status: Never   Smokeless tobacco: Never  Vaping Use   Vaping status: Never Used  Substance and Sexual Activity   Alcohol use: No   Drug use: No   Sexual activity: Not on file  Other Topics Concern   Not on file  Social History Narrative   Not on file   Social Drivers of Health   Financial Resource Strain: Not on file  Food Insecurity: Not on file  Transportation Needs: Not on file  Physical Activity: Not on file  Stress: Not on file  Social Connections: Not on file  Intimate Partner Violence: Not on file    Physical Exam: Vital signs in last 24 hours: BP 136/77   Temp 97.8 F (36.6 C)   Ht 5' 5 (1.651 m)   Wt 294 lb (133.4 kg)   SpO2 96%   BMI 48.92 kg/m  GEN: NAD EYE: Sclerae anicteric ENT: MMM CV: Non-tachycardic Pulm: No increased WOB GI: Soft NEURO:  Alert & Oriented   Estefana Kidney, MD [facility]   12/01/2023 8:11 AM

## 2023-12-01 NOTE — Patient Instructions (Addendum)
 Resume previous diet. Continue present medications.  Awaiting pathology results.  Handouts provided on hiatal hernia, esophagitis, polyps, diverticulosis, and hemorrhoids.   YOU HAD AN ENDOSCOPIC PROCEDURE TODAY AT THE [facility]:   Refer to the procedure report that was given to you for any specific questions about what was found during the examination.  If the procedure report does not answer your questions, please call your gastroenterologist to clarify.  If you requested that your care partner not be given the details of your procedure findings, then the procedure report has been included in a sealed envelope for you to review at your convenience later.  YOU SHOULD EXPECT: Some feelings of bloating in the abdomen. Passage of more gas than usual.  Walking can help get rid of the air that was put into your GI tract during the procedure and reduce the bloating. If you had a lower endoscopy (such as a colonoscopy or flexible sigmoidoscopy) you may notice spotting of blood in your stool or on the toilet paper. If you underwent a bowel prep for your procedure, you may not have a normal bowel movement for a few days.  Please Note:  You might notice some irritation and congestion in your nose or some drainage.  This is from the oxygen used during your procedure.  There is no need for concern and it should clear up in a day or so.  SYMPTOMS TO REPORT IMMEDIATELY:  Following lower endoscopy (colonoscopy or flexible sigmoidoscopy):  Excessive amounts of blood in the stool  Significant tenderness or worsening of abdominal pains  Swelling of the abdomen that is new, acute  Fever of 100F or higher  Following upper endoscopy (EGD)  Vomiting of blood or coffee ground material  New chest pain or pain under the shoulder blades  Painful or persistently difficult swallowing  New shortness of breath  Fever of 100F or higher  Black, tarry-looking stools  For urgent or emergent issues, a  gastroenterologist can be reached at any hour by calling [phone]. Do not use MyChart messaging for urgent concerns.    DIET:  We do recommend a small meal at first, but then you may proceed to your regular diet.  Drink plenty of fluids but you should avoid alcoholic beverages for 24 hours.  ACTIVITY:  You should plan to take it easy for the rest of today and you should NOT DRIVE or use heavy machinery until tomorrow (because of the sedation medicines used during the test).    FOLLOW UP: Our staff will call the number listed on your records the next business day following your procedure.  We will call around 7:15- 8:00 am to check on you and address any questions or concerns that you may have regarding the information given to you following your procedure. If we do not reach you, we will leave a message.     If any biopsies were taken you will be contacted by phone or by letter within the next 1-3 weeks.  Please call us  at [phone] if you have not heard about the biopsies in 3 weeks.    SIGNATURES/CONFIDENTIALITY: You and/or your care partner have signed paperwork which will be entered into your electronic medical record.  These signatures attest to the fact that that the information above on your After Visit Summary has been reviewed and is understood.  Full responsibility of the confidentiality of this discharge information lies with you and/or your care-partner.

## 2023-12-01 NOTE — Progress Notes (Signed)
 Report given to PACU, vss

## 2023-12-01 NOTE — Op Note (Signed)
 [facility] Patient Name: Abigail [name] Procedure Date: 12/01/2023 7:20 AM MRN: [id number] Endoscopist: Rosario Estefana Kidney , , [id number] Age: 58 Referring MD:  Date of Birth: Aug 28, 1965 Gender: Female Account #: [id number] Procedure:                Upper GI endoscopy Indications:              Heartburn Medicines:                Monitored Anesthesia Care Procedure:                Pre-Anesthesia Assessment:                           - Prior to the procedure, a History and Physical                            was performed, and patient medications and                            allergies were reviewed. The patient's tolerance of                            previous anesthesia was also reviewed. The risks                            and benefits of the procedure and the sedation                            options and risks were discussed with the patient.                            All questions were answered, and informed consent                            was obtained. Prior Anticoagulants: The patient has                            taken no anticoagulant or antiplatelet agents. ASA                            Grade Assessment: II - A patient with mild systemic                            disease. After reviewing the risks and benefits,                            the patient was deemed in satisfactory condition to                            undergo the procedure.                           After obtaining informed consent, the endoscope was  passed under direct vision. Throughout the                            procedure, the patient's blood pressure, pulse, and                            oxygen saturations were monitored continuously. The                            Olympus Scope SN [id number] was introduced through the                            mouth, and advanced to the second part of duodenum.                            The upper GI endoscopy was  accomplished without                            difficulty. The patient tolerated the procedure                            well. Scope In: Scope Out: Findings:                 Mucosal changes including ringed esophagus were                            found in the distal esophagus. Biopsies were                            obtained from the proximal and distal esophagus                            with cold forceps for histology of suspected                            eosinophilic esophagitis.                           A small hiatal hernia was present.                           Retained fluid was found in the gastric body.                           Three 5 to 10 mm sessile polyps with no bleeding                            and no stigmata of recent bleeding were found in                            the gastric body. These polyps were removed with a                            cold  snare. Resection and retrieval were complete.                           The examined duodenum was normal. Complications:            No immediate complications. Estimated Blood Loss:     Estimated blood loss was minimal. Impression:               - Esophageal mucosal changes.                           - Small hiatal hernia.                           - Retained gastric fluid.                           - Three gastric polyps. Resected and retrieved.                           - Normal examined duodenum.                           - Biopsies were taken with a cold forceps for                            evaluation of eosinophilic esophagitis. Recommendation:           - Await pathology results.                           - Perform a colonoscopy today. Dr Estefana Federico Rosario Estefana Federico,  12/01/2023 8:54:35 AM

## 2023-12-01 NOTE — Op Note (Signed)
 [facility] Patient Name: Abigail [name] Procedure Date: 12/01/2023 7:19 AM MRN: [id number] Endoscopist: Rosario Estefana Kidney , , [id number] Age: 58 Referring MD:  Date of Birth: 01/14/1966 Gender: Female Account #: [id number] Procedure:                Colonoscopy Indications:              High risk colon cancer surveillance: Personal                            history of colonic polyps Medicines:                Monitored Anesthesia Care Procedure:                Pre-Anesthesia Assessment:                           - Prior to the procedure, a History and Physical                            was performed, and patient medications and                            allergies were reviewed. The patient's tolerance of                            previous anesthesia was also reviewed. The risks                            and benefits of the procedure and the sedation                            options and risks were discussed with the patient.                            All questions were answered, and informed consent                            was obtained. Prior Anticoagulants: The patient has                            taken no anticoagulant or antiplatelet agents. ASA                            Grade Assessment: III - A patient with severe                            systemic disease. After reviewing the risks and                            benefits, the patient was deemed in satisfactory                            condition to undergo the procedure.  After obtaining informed consent, the colonoscope                            was passed under direct vision. Throughout the                            procedure, the patient's blood pressure, pulse, and                            oxygen saturations were monitored continuously. The                            CF [id number] #[id number] was introduced through the anus                            and advanced to the the  terminal ileum. The                            colonoscopy was performed without difficulty. The                            patient tolerated the procedure well. The quality                            of the bowel preparation was excellent. The                            terminal ileum, ileocecal valve, appendiceal                            orifice, and rectum were photographed. Scope In: 8:36:19 AM Scope Out: 8:46:09 AM Scope Withdrawal Time: 0 hours 7 minutes 54 seconds  Total Procedure Duration: 0 hours 9 minutes 50 seconds  Findings:                 The terminal ileum appeared normal.                           Four sessile polyps were found in the transverse                            colon, ascending colon and cecum. The polyps were 3                            to 6 mm in size. These polyps were removed with a                            cold snare. Resection and retrieval were complete.                           Multiple diverticula were found in the sigmoid                            colon and descending colon.  Non-bleeding internal hemorrhoids were found during                            retroflexion. Complications:            No immediate complications. Estimated Blood Loss:     Estimated blood loss was minimal. Impression:               - The examined portion of the ileum was normal.                           - Four 3 to 6 mm polyps in the transverse colon, in                            the ascending colon and in the cecum, removed with                            a cold snare. Resected and retrieved.                           - Diverticulosis in the sigmoid colon and in the                            descending colon.                           - Non-bleeding internal hemorrhoids. Recommendation:           - Discharge patient to home (with escort).                           - Await pathology results.                           - The findings and  recommendations were discussed                            with the patient. Dr Estefana Federico Rosario Estefana Federico,  12/01/2023 8:57:38 AM

## 2023-12-01 NOTE — Progress Notes (Signed)
 Called to room to assist during endoscopic procedure.  Patient ID and intended procedure confirmed with present staff. Received instructions for my participation in the procedure from the performing physician.

## 2023-12-01 NOTE — Progress Notes (Signed)
0815 Robinul 0.1 mg IV given due large amount of secretions upon assessment.  MD made aware, vss  °

## 2023-12-02 ENCOUNTER — Telehealth: Payer: Self-pay

## 2023-12-02 NOTE — Telephone Encounter (Signed)
 No answer after follow up call. Voice message left.

## 2023-12-03 ENCOUNTER — Ambulatory Visit: Payer: Self-pay | Admitting: Internal Medicine

## 2023-12-03 LAB — SURGICAL PATHOLOGY

## 2024-01-05 ENCOUNTER — Other Ambulatory Visit: Payer: Self-pay

## 2024-01-05 MED ORDER — TIRZEPATIDE 5 MG/0.5ML ~~LOC~~ SOAJ: 5.0000 mg | SUBCUTANEOUS | 0 refills | Status: DC

## 2024-01-05 NOTE — Telephone Encounter (Signed)
 ERROR

## 2024-04-13 ENCOUNTER — Other Ambulatory Visit: Payer: Self-pay | Admitting: Family Medicine

## 2024-05-11 ENCOUNTER — Other Ambulatory Visit: Payer: Self-pay | Admitting: Family Medicine

## 2024-05-24 ENCOUNTER — Other Ambulatory Visit: Payer: Self-pay | Admitting: Family Medicine
